# Patient Record
Sex: Female | Born: 1985 | Race: Black or African American | Hispanic: No | Marital: Married | Smoking: Former smoker
Health system: Southern US, Community
[De-identification: ages and names within clinical notes are randomized; demographics above are authoritative.]

## PROBLEM LIST (undated history)

## (undated) DIAGNOSIS — A630 Anogenital (venereal) warts: Secondary | ICD-10-CM

## (undated) DIAGNOSIS — E559 Vitamin D deficiency, unspecified: Secondary | ICD-10-CM

## (undated) DIAGNOSIS — IMO0002 Reserved for concepts with insufficient information to code with codable children: Secondary | ICD-10-CM

## (undated) HISTORY — DX: Vitamin D deficiency, unspecified: E55.9

## (undated) HISTORY — DX: Reserved for concepts with insufficient information to code with codable children: IMO0002

## (undated) HISTORY — DX: Anogenital (venereal) warts: A63.0

---
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
## 2007-01-10 ENCOUNTER — Emergency Department: Payer: Self-pay

## 2007-01-24 ENCOUNTER — Emergency Department: Payer: Self-pay | Admitting: Emergency Medicine

## 2009-03-16 ENCOUNTER — Emergency Department: Payer: Self-pay | Admitting: Emergency Medicine

## 2009-04-23 ENCOUNTER — Emergency Department: Payer: Self-pay | Admitting: Emergency Medicine

## 2009-08-30 ENCOUNTER — Emergency Department: Payer: Self-pay | Admitting: Emergency Medicine

## 2011-06-26 ENCOUNTER — Emergency Department: Payer: Self-pay | Admitting: Unknown Physician Specialty

## 2011-09-02 ENCOUNTER — Emergency Department: Payer: Self-pay | Admitting: Emergency Medicine

## 2012-04-27 ENCOUNTER — Emergency Department: Payer: Self-pay | Admitting: Emergency Medicine

## 2012-04-27 LAB — URINALYSIS, COMPLETE
Blood: NEGATIVE
Glucose,UR: NEGATIVE mg/dL (ref 0–75)
Ketone: NEGATIVE
Leukocyte Esterase: NEGATIVE
Nitrite: NEGATIVE
Protein: NEGATIVE
Specific Gravity: 1.019 (ref 1.003–1.030)
Squamous Epithelial: 6
WBC UR: 1 /HPF (ref 0–5)

## 2012-04-27 LAB — COMPREHENSIVE METABOLIC PANEL
Albumin: 3.4 g/dL (ref 3.4–5.0)
Anion Gap: 9 (ref 7–16)
Bilirubin,Total: 0.4 mg/dL (ref 0.2–1.0)
Calcium, Total: 8.2 mg/dL — ABNORMAL LOW (ref 8.5–10.1)
Co2: 22 mmol/L (ref 21–32)
EGFR (African American): >60
Glucose: 93 mg/dL (ref 65–99)
Potassium: 4.1 mmol/L (ref 3.5–5.1)
SGOT(AST): 23 U/L (ref 15–37)
Sodium: 143 mmol/L (ref 136–145)

## 2012-04-27 LAB — CBC
HGB: 13.3 g/dL (ref 12.0–16.0)
MCH: 32 pg (ref 26.0–34.0)
MCHC: 32.6 g/dL (ref 32.0–36.0)
MCV: 98 fL (ref 80–100)
Platelet: 352 10*3/uL (ref 150–440)
RDW: 12.9 % (ref 11.5–14.5)

## 2012-04-27 LAB — PREGNANCY, URINE: Pregnancy Test, Urine: NEGATIVE m[IU]/mL

## 2012-04-27 LAB — WET PREP, GENITAL

## 2012-10-13 ENCOUNTER — Emergency Department: Payer: Self-pay | Admitting: Emergency Medicine

## 2012-10-13 LAB — URINALYSIS, COMPLETE
Bilirubin,UR: NEGATIVE
Blood: NEGATIVE
Glucose,UR: NEGATIVE mg/dL (ref 0–75)
Leukocyte Esterase: NEGATIVE
RBC,UR: 1 /HPF (ref 0–5)
Squamous Epithelial: 3
WBC UR: 1 /HPF (ref 0–5)

## 2013-09-07 ENCOUNTER — Emergency Department: Payer: Self-pay | Admitting: Emergency Medicine

## 2015-03-24 LAB — HM PAP SMEAR

## 2015-03-25 LAB — HEMOGLOBIN A1C: Hgb A1c MFr Bld: 5.4 % (ref 4.0–6.0)

## 2015-03-25 LAB — LIPID PANEL
Cholesterol: 128 mg/dL (ref 0–200)
HDL: 56 mg/dL (ref 35–70)
LDL Cholesterol: 47 mg/dL
Triglycerides: 125 mg/dL (ref 40–160)

## 2015-03-25 LAB — TSH: TSH: 1.59 u[IU]/mL (ref <=5.90)

## 2015-05-09 ENCOUNTER — Telehealth: Payer: Self-pay | Admitting: Obstetrics and Gynecology

## 2015-05-10 NOTE — Telephone Encounter (Signed)
That should be fine, if it doesn't work let us know & I can send in a prescription

## 2015-05-10 NOTE — Telephone Encounter (Signed)
Pt called c/o slight vaginal odor, pt refilled her flagyl has only taking 1 pill, advised pt to complete course And to call us back if she still had sx

## 2015-05-19 ENCOUNTER — Encounter: Payer: Self-pay | Admitting: *Deleted

## 2015-05-20 ENCOUNTER — Encounter: Payer: Self-pay | Admitting: Obstetrics and Gynecology

## 2015-05-20 ENCOUNTER — Ambulatory Visit (INDEPENDENT_AMBULATORY_CARE_PROVIDER_SITE_OTHER): Payer: 59 | Admitting: Obstetrics and Gynecology

## 2015-05-20 VITALS — BP 115/86 | HR 93 | Temp 98.6°F | Ht 70.0 in | Wt 387.6 lb

## 2015-05-20 DIAGNOSIS — J0100 Acute maxillary sinusitis, unspecified: Secondary | ICD-10-CM | POA: Diagnosis not present

## 2015-05-20 DIAGNOSIS — H65191 Other acute nonsuppurative otitis media, right ear: Secondary | ICD-10-CM | POA: Diagnosis not present

## 2015-05-20 DIAGNOSIS — Z113 Encounter for screening for infections with a predominantly sexual mode of transmission: Secondary | ICD-10-CM | POA: Diagnosis not present

## 2015-05-20 MED ORDER — CEFDINIR 300 MG PO CAPS: 300.0000 mg | ORAL_CAPSULE | Freq: Two times a day (BID) | ORAL | Status: DC

## 2015-05-20 NOTE — Progress Notes (Signed)
Patient ID: Christina Bowen, female   DOB: 18-Oct-1986, 29 y.o.   MRN: [id number] Sexually Transmitted Disease Check: Patient presents for sexually transmitted disease check. Sexual history reviewed with the patient. STD exposure: current sexual partner thought to have history of none and admitted to cheating on her.  Previous history of STD:  HSV, trichomonas, HPV. Current symptoms include vaginal discharge: scant and yellow.  Contraception: condoms   O: scant bloody d/c noted on speculum exam- swab obtained, external vulva and vaginal vault w/o lesions.  A: STD screening- declines blood screening at this time  P: NuSwab- will call with results.    Marland KitchenSINUSITIS Onset: 5 days ago  Location: nasal passages and right ear Description: pressure and green drainage Modifying factors: none  Symptoms Cough: non-productive from postnasal drip  Discharge:  green Fever: none Sinus Pressure: bilateral frontal  Ears Blocked:  Right worse than left Teeth Ache:  mild Frontal Headache:  moderate Second Sickening:  none  Red Flags Change in mental state: none Change in vision: none   O: nasal mucosa red and boggy with green mucus noted Right tympanic membrane red and bulging with purelent fluid noted behind it.  A: sinusitis with right otitis media  P: OMnicef 300mg  bid x 14d Tylenol cold & sinus prn  Gennie Alma, CNM

## 2015-05-24 ENCOUNTER — Telehealth: Payer: Self-pay | Admitting: Obstetrics and Gynecology

## 2015-05-24 LAB — NUSWAB VAGINITIS PLUS (VG+)
Atopobium vaginae: HIGH Score — AB
Candida glabrata, NAA: NEGATIVE
Megasphaera 1: HIGH Score — AB

## 2015-05-24 NOTE — Telephone Encounter (Signed)
PT CALLED AND IS VERY EAGER ABOUT WANTING TO KNOW RESULTS OF HER VAGINAL SWAB SHE HAD DONE LAST Friday, WAS TOLD THAT SOMEONE WOULD CALL HER Monday, AND SHE HASNT HEARD ANYTHING, WANTS A CALL BACK TODAY BEFORE WE CLOSE.

## 2015-05-25 MED ORDER — METRONIDAZOLE 500 MG PO TABS: 500.0000 mg | ORAL_TABLET | Freq: Two times a day (BID) | ORAL | Status: DC

## 2015-05-25 NOTE — Telephone Encounter (Signed)
LMTRC. BV per nuswab. Flagyl erx.

## 2015-05-25 NOTE — Telephone Encounter (Signed)
Pt aware.

## 2015-05-26 ENCOUNTER — Other Ambulatory Visit: Payer: Self-pay | Admitting: Obstetrics and Gynecology

## 2015-05-26 DIAGNOSIS — N760 Acute vaginitis: Principal | ICD-10-CM

## 2015-05-26 DIAGNOSIS — B9689 Other specified bacterial agents as the cause of diseases classified elsewhere: Secondary | ICD-10-CM

## 2015-05-26 MED ORDER — CLINDAMYCIN HCL 300 MG PO CAPS: 300.0000 mg | ORAL_CAPSULE | Freq: Two times a day (BID) | ORAL | Status: DC

## 2015-05-26 NOTE — Progress Notes (Signed)
Quick Note:  Please let her know her swab came back showing BV only- I sent in a prescription for the cleocin suppositories- to place one vaginally at bedtime x 4 days, then can use remaining ones as needed. ______

## 2015-05-27 ENCOUNTER — Telehealth: Payer: Self-pay | Admitting: Obstetrics and Gynecology

## 2015-05-27 NOTE — Telephone Encounter (Signed)
PT WANTED TO KNOW IF THERE WAS A CREAM OR SUPOSITORY THAT SHE WAS SUPPOSED TO PICK UP FROM PHARMACY IN ADDITON TO HER OTHER MED.

## 2015-05-31 NOTE — Telephone Encounter (Signed)
We were still working on prior auth for the cleocin, so only one should have been ready at that time

## 2015-05-31 NOTE — Telephone Encounter (Signed)
PT AWARE  

## 2015-06-02 ENCOUNTER — Other Ambulatory Visit: Payer: 59

## 2015-06-02 DIAGNOSIS — Z113 Encounter for screening for infections with a predominantly sexual mode of transmission: Secondary | ICD-10-CM

## 2015-06-03 ENCOUNTER — Other Ambulatory Visit: Payer: Self-pay | Admitting: Obstetrics and Gynecology

## 2015-06-03 DIAGNOSIS — Z113 Encounter for screening for infections with a predominantly sexual mode of transmission: Secondary | ICD-10-CM

## 2015-06-04 LAB — HIV ANTIBODY (ROUTINE TESTING W REFLEX): HIV Screen 4th Generation wRfx: NONREACTIVE

## 2015-06-04 LAB — RPR: RPR Ser Ql: NONREACTIVE

## 2015-06-04 LAB — HEPATITIS C ANTIBODY: Hep C Virus Ab: <0.1 s/co ratio (ref 0.0–0.9)

## 2015-06-07 ENCOUNTER — Telehealth: Payer: Self-pay | Admitting: *Deleted

## 2015-06-07 NOTE — Telephone Encounter (Signed)
-----   Message from Evonnie Pat, [place] sent at 06/04/2015 11:55 AM EDT ----- Please let her know all STD blood results were negative

## 2015-06-07 NOTE — Telephone Encounter (Signed)
Notified pt of normal lab results

## 2015-06-10 ENCOUNTER — Emergency Department
Admission: EM | Admit: 2015-06-10 | Discharge: 2015-06-10 | Disposition: A | Discharge status: Home / Self Care | Payer: Managed Care, Other (non HMO) | Attending: Emergency Medicine | Admitting: Emergency Medicine

## 2015-06-10 DIAGNOSIS — J029 Acute pharyngitis, unspecified: Secondary | ICD-10-CM | POA: Insufficient documentation

## 2015-06-10 DIAGNOSIS — Z79899 Other long term (current) drug therapy: Secondary | ICD-10-CM | POA: Insufficient documentation

## 2015-06-10 DIAGNOSIS — Z87891 Personal history of nicotine dependence: Secondary | ICD-10-CM | POA: Insufficient documentation

## 2015-06-10 LAB — POCT RAPID STREP A: Streptococcus, Group A Screen (Direct): NEGATIVE

## 2015-06-10 MED ORDER — IBUPROFEN 800 MG PO TABS: 800.0000 mg | ORAL_TABLET | Freq: Three times a day (TID) | ORAL | Status: DC | PRN

## 2015-06-10 NOTE — ED Notes (Signed)
Dr Archie Balboa in room with patient

## 2015-06-10 NOTE — ED Provider Notes (Signed)
[facility] Emergency Department Provider Note    ____________________________________________  Time seen: 0450  I have reviewed the triage vital signs and the nursing notes.   HISTORY  Chief Complaint Sore Throat   History limited by: Not Limited   HPI Christina Bowen is a 29 y.o. female who presents to the emergency today because of sore throat. Patient states it started yesterday has gotten worse. Patient states she has also had a little bit of ear discomfort. Furthermore the patient states she usually has prongs with her sinuses but has not noticed any drainage recently. Did notice some white plaques on her tonsils. Denies any fevers. He tried taking one Advil prior to presenting to the emergency department. Patient does have some concerns they might be an oral STD given that she is in a relationship.     Past Medical History  Diagnosis Date  . HPV (human papilloma virus) anogenital infection   . Vitamin D deficiency disease   . LGSIL (low grade squamous intraepithelial dysplasia)     There are no active problems to display for this patient.   No past surgical history on file.  Current Outpatient Rx  Name  Route  Sig  Dispense  Refill  . cefdinir (OMNICEF) 300 MG capsule   Oral   Take 1 capsule (300 mg total) by mouth 2 (two) times daily.   14 capsule   1   . clindamycin (CLEOCIN) 300 MG capsule   Oral   Take 1 capsule (300 mg total) by mouth 2 (two) times daily. For seven days   14 capsule   2   . metroNIDAZOLE (FLAGYL) 500 MG tablet   Oral   Take 1 tablet (500 mg total) by mouth 2 (two) times daily.   14 tablet   0   . TRI-PREVIFEM 0.18/0.215/0.25 MG-35 MCG tablet   Oral   Take 1 tablet by mouth daily.      6     Dispense as written.   . Vitamin D, Ergocalciferol, (DRISDOL) 50000 UNITS CAPS capsule   Oral   Take 1 capsule by mouth 2 (two) times a week.      3     Allergies Review of patient's allergies indicates no  known allergies.  Family History  Problem Relation Age of Onset  . Diabetes Father   . Heart disease Maternal Grandmother     Social History History  Substance Use Topics  . Smoking status: Former Research scientist (life sciences)  . Smokeless tobacco: Never Used  . Alcohol Use: Yes    Review of Systems  Constitutional: Negative for fever. Cardiovascular: Negative for chest pain. Respiratory: Negative for shortness of breath. Gastrointestinal: Negative for abdominal pain, vomiting and diarrhea. Genitourinary: Negative for dysuria. Musculoskeletal: Negative for back pain. Skin: Negative for rash. Neurological: Negative for headaches, focal weakness or numbness.   10-point ROS otherwise negative.  ____________________________________________   PHYSICAL EXAM:  VITAL SIGNS: ED Triage Vitals  Enc Vitals Group     BP 06/10/15 0423 180/92 mmHg     Pulse Rate 06/10/15 0423 85     Resp 06/10/15 0423 20     Temp 06/10/15 0423 98 F (36.7 C)     Temp Source 06/10/15 0423 Oral     SpO2 06/10/15 0423 98 %     Weight 06/10/15 0423 370 lb (167.831 kg)     Height 06/10/15 0423 5\' 10"  (1.778 m)     Head Cir --      Peak  Flow --      Pain Score 06/10/15 0424 10   Constitutional: Alert and oriented. Well appearing and in no distress. Eyes: Conjunctivae are normal. PERRL. Normal extraocular movements. ENT   Head: Normocephalic and atraumatic.   Nose: No congestion/rhinnorhea.   Mouth/Throat: Mucous membranes are moist. Pharyngeal exudate or erythema. No unilateral swelling. Uvula midline.   Neck: No stridor. Hematological/Lymphatic/Immunilogical: No cervical lymphadenopathy. Cardiovascular: Normal rate, regular rhythm.  No murmurs, rubs, or gallops. Respiratory: Normal respiratory effort without tachypnea nor retractions. Breath sounds are clear and equal bilaterally. No wheezes/rales/rhonchi. Genitourinary: Deferred Musculoskeletal: Normal range of motion in all extremities. No joint  effusions.  No lower extremity tenderness nor edema. Neurologic:  Normal speech and language. No gross focal neurologic deficits are appreciated. Speech is normal.  Skin:  Skin is warm, dry and intact. No rash noted. Psychiatric: Mood and affect are normal. Speech and behavior are normal. Patient exhibits appropriate insight and judgment.  ____________________________________________    LABS (pertinent positives/negatives)  Labs Reviewed  RAPID STREP SCREEN (NOT AT [facility])  POCT RAPID STREP A     ____________________________________________   EKG  None  ____________________________________________    RADIOLOGY  None  ____________________________________________   PROCEDURES  Procedure(s) performed: None  Critical Care performed: No  ____________________________________________   INITIAL IMPRESSION / ASSESSMENT AND PLAN / ED COURSE  Pertinent labs & imaging results that were available during my care of the patient were reviewed by me and considered in my medical decision making (see chart for details).  She here with sore throat. Rapid strep negative. Believe this is likely to be viral pharyngitis. I did discuss with patient she has any concerns for STD she can follow up with primary care physician.  ____________________________________________   FINAL CLINICAL IMPRESSION(S) / ED DIAGNOSES  Final diagnoses:  Pharyngitis     Nance Pear, MD 06/10/15 [phone]

## 2015-06-10 NOTE — Discharge Instructions (Signed)
Please seek medical attention for any high fevers, chest pain, shortness of breath, change in behavior, persistent vomiting, bloody stool or any other new or concerning symptoms.  Pharyngitis Pharyngitis is redness, pain, and swelling (inflammation) of your pharynx.  CAUSES  Pharyngitis is usually caused by infection. Most of the time, these infections are from viruses (viral) and are part of a cold. However, sometimes pharyngitis is caused by bacteria (bacterial). Pharyngitis can also be caused by allergies. Viral pharyngitis may be spread from person to person by coughing, sneezing, and personal items or utensils (cups, forks, spoons, toothbrushes). Bacterial pharyngitis may be spread from person to person by more intimate contact, such as kissing.  SIGNS AND SYMPTOMS  Symptoms of pharyngitis include:   Sore throat.   Tiredness (fatigue).   Low-grade fever.   Headache.  Joint pain and muscle aches.  Skin rashes.  Swollen lymph nodes.  Plaque-like film on throat or tonsils (often seen with bacterial pharyngitis). DIAGNOSIS  Your health care provider will ask you questions about your illness and your symptoms. Your medical history, along with a physical exam, is often all that is needed to diagnose pharyngitis. Sometimes, a rapid strep test is done. Other lab tests may also be done, depending on the suspected cause.  TREATMENT  Viral pharyngitis will usually get better in 3-4 days without the use of medicine. Bacterial pharyngitis is treated with medicines that kill germs (antibiotics).  HOME CARE INSTRUCTIONS   Drink enough water and fluids to keep your urine clear or pale yellow.   Only take over-the-counter or prescription medicines as directed by your health care provider:   If you are prescribed antibiotics, make sure you finish them even if you start to feel better.   Do not take aspirin.   Get lots of rest.   Gargle with 8 oz of salt water ( tsp of salt per 1  qt of water) as often as every 1-2 hours to soothe your throat.   Throat lozenges (if you are not at risk for choking) or sprays may be used to soothe your throat. SEEK MEDICAL CARE IF:   You have large, tender lumps in your neck.  You have a rash.  You cough up green, yellow-brown, or bloody spit. SEEK IMMEDIATE MEDICAL CARE IF:   Your neck becomes stiff.  You drool or are unable to swallow liquids.  You vomit or are unable to keep medicines or liquids down.  You have severe pain that does not go away with the use of recommended medicines.  You have trouble breathing (not caused by a stuffy nose). MAKE SURE YOU:   Understand these instructions.  Will watch your condition.  Will get help right away if you are not doing well or get worse. Document Released: 11/19/2005 Document Revised: 09/09/2013 Document Reviewed: 07/27/2013 [facility] Patient Information 2015 [place], [place]. This information is not intended to replace advice given to you by your health care provider. Make sure you discuss any questions you have with your health care provider.

## 2015-06-10 NOTE — ED Notes (Signed)
Pt to ED c/o sore throat since yesterday.

## 2015-06-10 NOTE — ED Notes (Addendum)
Pt reports having had a sore throat since yesterday morning, with white patches in her throat. Pt denies fevers. Pt reports that her ears are starting to ache now also. Pt is worried that it might be an oral STD, just trying to figure out what all of the possibilities with having the red and white patches in her throat. Pt reports taking 1 advil prior to coming to the ER

## 2015-06-12 LAB — CULTURE, GROUP A STREP (THRC): Special Requests: NORMAL

## 2015-07-22 ENCOUNTER — Other Ambulatory Visit: Payer: Self-pay | Admitting: Obstetrics and Gynecology

## 2015-07-22 ENCOUNTER — Telehealth: Payer: Self-pay | Admitting: Obstetrics and Gynecology

## 2015-07-22 MED ORDER — METRONIDAZOLE 500 MG PO TABS: 500.0000 mg | ORAL_TABLET | Freq: Two times a day (BID) | ORAL | Status: DC

## 2015-07-22 NOTE — Telephone Encounter (Signed)
Pt is having vaginal d/c with odor would like refill on her flagyl please

## 2015-07-22 NOTE — Telephone Encounter (Signed)
Notified pt rx ready 

## 2015-07-22 NOTE — Telephone Encounter (Signed)
Patient called requesting a call back regarding vaginal odor and discharge. You can reach her at [phone]

## 2015-07-22 NOTE — Telephone Encounter (Signed)
Please let her know i sent it in

## 2015-10-26 ENCOUNTER — Ambulatory Visit (INDEPENDENT_AMBULATORY_CARE_PROVIDER_SITE_OTHER): Payer: 59 | Admitting: Obstetrics and Gynecology

## 2015-10-26 ENCOUNTER — Encounter: Payer: Self-pay | Admitting: Obstetrics and Gynecology

## 2015-10-26 VITALS — BP 111/85 | HR 87 | Wt >= 6400 oz

## 2015-10-26 DIAGNOSIS — R87612 Low grade squamous intraepithelial lesion on cytologic smear of cervix (LGSIL): Secondary | ICD-10-CM

## 2015-10-26 MED ORDER — NORETHIN-ETH ESTRADIOL-FE 0.8-25 MG-MCG PO CHEW
1.0000 | CHEWABLE_TABLET | Freq: Every day | ORAL | Status: DC
Start: 2015-10-26 — End: 2018-05-06

## 2015-10-26 NOTE — Progress Notes (Signed)
Subjective:     Patient ID: Christina Bowen, female   DOB: [birth year], 29 y.o.   MRN: [id number]  HPI Here for follow up pap- had LGSIL and colpo 6 months ago Recurrent BV  Review of Systems Last treated for BV this week with Clindesse gel and feels like still battling with BV reoccuring. Also had sex 2 weeks ago with new partner and expereinced pain with deep penetration, none since.  Stopped OCPs early Oct due to amenorrhea and had a period the first of nov, desires a different pill that will give her a period every month.    Objective:   Physical Exam A&O x4 Well groomed obese female in no distress Pelvic exam: normal external genitalia, vulva, vagina, cervix, uterus and adnexa, WET MOUNT done - results: negative for pathogens, normal epithelial cells, vaginal pH is 6.5.    Assessment:     Follow up LGSIL pap Recurrent BV OCP user     Plan:     Switch to Generesse- to start on 4th day of next menses Stop clindesse until confirmed BV in future Will repeat pap in 6 months unless today's results indicate otherwise.  Melody Shindler, CNM

## 2015-10-31 ENCOUNTER — Other Ambulatory Visit: Payer: Self-pay | Admitting: Obstetrics and Gynecology

## 2015-11-01 LAB — CYTOLOGY - PAP

## 2016-01-12 ENCOUNTER — Telehealth: Payer: Self-pay | Admitting: Obstetrics and Gynecology

## 2016-01-12 NOTE — Telephone Encounter (Signed)
Pt is on the metronylazol pill, and she wanted to know if there was another pill that is effective because she thinks she is getting immune to it and she doesn't want the cream because its 100.00, but if not that's fine she just wanted to know.

## 2016-01-12 NOTE — Telephone Encounter (Signed)
tindamax is an option, but it can be more expensive too

## 2016-01-18 ENCOUNTER — Other Ambulatory Visit: Payer: Self-pay | Admitting: Obstetrics and Gynecology

## 2016-01-18 MED ORDER — TINIDAZOLE 500 MG PO TABS: 500.0000 mg | ORAL_TABLET | Freq: Every day | ORAL | Status: DC

## 2016-01-18 NOTE — Telephone Encounter (Signed)
Pt would like to try the tindamax

## 2016-01-18 NOTE — Telephone Encounter (Signed)
Left detailed message about pts medication

## 2016-02-03 ENCOUNTER — Telehealth: Payer: Self-pay | Admitting: Obstetrics and Gynecology

## 2016-02-03 NOTE — Telephone Encounter (Signed)
Patient called stating that she went to get her script for sinus infection but it was too expensive. She would like something cheaper called in. Thanks

## 2016-02-14 NOTE — Telephone Encounter (Signed)
Called pt several times, I advsied pt she may need to make appt and be seen

## 2016-03-29 ENCOUNTER — Ambulatory Visit (INDEPENDENT_AMBULATORY_CARE_PROVIDER_SITE_OTHER): Payer: Managed Care, Other (non HMO) | Admitting: Obstetrics and Gynecology

## 2016-03-29 ENCOUNTER — Encounter: Payer: Self-pay | Admitting: Obstetrics and Gynecology

## 2016-03-29 VITALS — BP 134/89 | HR 91 | Wt >= 6400 oz

## 2016-03-29 DIAGNOSIS — Z01419 Encounter for gynecological examination (general) (routine) without abnormal findings: Secondary | ICD-10-CM | POA: Diagnosis not present

## 2016-03-29 DIAGNOSIS — R350 Frequency of micturition: Secondary | ICD-10-CM | POA: Diagnosis not present

## 2016-03-29 LAB — POCT URINALYSIS DIPSTICK
Bilirubin, UA: NEGATIVE
Glucose, UA: NEGATIVE
KETONES UA: NEGATIVE
Nitrite, UA: NEGATIVE
PH UA: 6.5
PROTEIN UA: NEGATIVE
RBC UA: NEGATIVE
Spec Grav, UA: 1.01
UROBILINOGEN UA: NEGATIVE

## 2016-03-29 MED ORDER — TINIDAZOLE 500 MG PO TABS: 500.0000 mg | ORAL_TABLET | Freq: Every day | ORAL | Status: DC

## 2016-03-29 NOTE — Patient Instructions (Signed)
Place annual gynecologic exam patient instructions here.

## 2016-03-29 NOTE — Progress Notes (Signed)
  Subjective:     Christina Bowen is a 30 y.o. female and is here for a comprehensive physical exam. The patient reports weight gain, pedal edema, and occasional increased BP.  Social History   Social History  . Marital Status: Single    Spouse Name: N/A  . Number of Children: N/A  . Years of Education: N/A   Occupational History  . Not on file.   Social History Main Topics  . Smoking status: Former Research scientist (life sciences)  . Smokeless tobacco: Never Used  . Alcohol Use: Yes  . Drug Use: No  . Sexual Activity: Yes    Birth Control/ Protection: Pill   Other Topics Concern  . Not on file   Social History Narrative   Health Maintenance  Topic Date Due  . Samul Dada  08/15/2007  . INFLUENZA VACCINE  07/03/2016  . PAP SMEAR  10/30/2018  . HIV Screening  Completed    The following portions of the patient's history were reviewed and updated as appropriate: allergies, current medications, past family history, past medical history, past social history, past surgical history and problem list.  Review of Systems Pertinent items noted in HPI and remainder of comprehensive ROS otherwise negative.   Objective:    General appearance: alert, cooperative, appears stated age and morbidly obese Neck: no carotid bruit, no JVD, supple, symmetrical, trachea midline and thyroid not enlarged, symmetric, no tenderness/mass/nodules Lungs: clear to auscultation bilaterally Breasts: normal appearance, no masses or tenderness Heart: regular rate and rhythm, S1, S2 normal, no murmur, click, rub or gallop Abdomen: soft, non-tender; bowel sounds normal; no masses,  no organomegaly Pelvic: cervix normal in appearance, external genitalia normal, no adnexal masses or tenderness, no cervical motion tenderness, rectovaginal septum normal, uterus normal size, shape, and consistency and vagina normal without discharge Extremities: extremities normal, atraumatic, no cyanosis or edema    Assessment:    Healthy female  exam. Morbid obesity H/O recurrent BV      Plan:  Labs obtained with repeat pap   See After Visit Summary for Counseling Recommendations

## 2016-03-30 ENCOUNTER — Other Ambulatory Visit: Payer: Self-pay | Admitting: Obstetrics and Gynecology

## 2016-03-30 LAB — RPR: RPR Ser Ql: NONREACTIVE

## 2016-03-30 LAB — LIPID PANEL
CHOLESTEROL TOTAL: 175 mg/dL (ref 100–199)
Chol/HDL Ratio: 3.8 ratio units (ref 0.0–4.4)
HDL: 46 mg/dL (ref >39)
LDL Calculated: 88 mg/dL (ref 0–99)
Triglycerides: 203 mg/dL — ABNORMAL HIGH (ref 0–149)
VLDL CHOLESTEROL CAL: 41 mg/dL — AB (ref 5–40)

## 2016-03-30 LAB — COMPREHENSIVE METABOLIC PANEL
ALBUMIN: 4.2 g/dL (ref 3.5–5.5)
ALT: 22 IU/L (ref 0–32)
AST: 16 IU/L (ref 0–40)
Albumin/Globulin Ratio: 1.9 (ref 1.2–2.2)
Alkaline Phosphatase: 43 IU/L (ref 39–117)
BUN / CREAT RATIO: 17 (ref 9–23)
BUN: 11 mg/dL (ref 6–20)
Bilirubin Total: <0.2 mg/dL (ref 0.0–1.2)
CO2: 19 mmol/L (ref 18–29)
CREATININE: 0.63 mg/dL (ref 0.57–1.00)
Calcium: 9 mg/dL (ref 8.7–10.2)
Chloride: 104 mmol/L (ref 96–106)
GFR, EST AFRICAN AMERICAN: 142 mL/min/{1.73_m2} (ref >59)
GFR, EST NON AFRICAN AMERICAN: 123 mL/min/{1.73_m2} (ref >59)
GLOBULIN, TOTAL: 2.2 g/dL (ref 1.5–4.5)
GLUCOSE: 95 mg/dL (ref 65–99)
Potassium: 4.4 mmol/L (ref 3.5–5.2)
SODIUM: 141 mmol/L (ref 134–144)
Total Protein: 6.4 g/dL (ref 6.0–8.5)

## 2016-03-30 LAB — HEPATITIS C ANTIBODY

## 2016-03-30 LAB — HEMOGLOBIN A1C
Est. average glucose Bld gHb Est-mCnc: 108 mg/dL
Hgb A1c MFr Bld: 5.4 % (ref 4.8–5.6)

## 2016-03-30 LAB — THYROID PANEL WITH TSH
Free Thyroxine Index: 1.7 (ref 1.2–4.9)
T3 Uptake Ratio: 30 % (ref 24–39)
T4, Total: 5.6 ug/dL (ref 4.5–12.0)
TSH: 1.01 u[IU]/mL (ref 0.450–4.500)

## 2016-03-30 LAB — HIV ANTIBODY (ROUTINE TESTING W REFLEX): HIV SCREEN 4TH GENERATION: NONREACTIVE

## 2016-04-02 LAB — CYTOLOGY - PAP

## 2016-04-25 ENCOUNTER — Encounter: Payer: 59 | Admitting: Obstetrics and Gynecology

## 2016-05-31 ENCOUNTER — Telehealth: Payer: Self-pay | Admitting: *Deleted

## 2016-05-31 ENCOUNTER — Other Ambulatory Visit: Payer: Self-pay | Admitting: Obstetrics and Gynecology

## 2016-05-31 MED ORDER — FLUCONAZOLE 150 MG PO TABS: 150.0000 mg | ORAL_TABLET | Freq: Once | ORAL | Status: DC

## 2016-05-31 NOTE — Telephone Encounter (Signed)
pls advise

## 2016-05-31 NOTE — Telephone Encounter (Signed)
Please let her know i sent in RX for diflucan and give instructions

## 2016-05-31 NOTE — Telephone Encounter (Signed)
Left detailed message.   

## 2016-05-31 NOTE — Telephone Encounter (Signed)
Patietn called and stated that she thinks shge has a yeast infection . She was wondering if Melody can send her something to her pharmacy or if she can suggest something over the counter. Her call back number is [phone]. Thanks

## 2016-07-02 ENCOUNTER — Encounter: Payer: Self-pay | Admitting: Obstetrics and Gynecology

## 2016-07-02 ENCOUNTER — Ambulatory Visit (INDEPENDENT_AMBULATORY_CARE_PROVIDER_SITE_OTHER): Payer: Managed Care, Other (non HMO) | Admitting: Obstetrics and Gynecology

## 2016-07-02 VITALS — BP 120/66 | HR 85 | Ht 70.0 in | Wt >= 6400 oz

## 2016-07-02 DIAGNOSIS — D239 Other benign neoplasm of skin, unspecified: Secondary | ICD-10-CM | POA: Diagnosis not present

## 2016-07-02 DIAGNOSIS — E669 Obesity, unspecified: Secondary | ICD-10-CM

## 2016-07-02 DIAGNOSIS — R87619 Unspecified abnormal cytological findings in specimens from cervix uteri: Secondary | ICD-10-CM

## 2016-07-02 DIAGNOSIS — D229 Melanocytic nevi, unspecified: Secondary | ICD-10-CM

## 2016-07-02 MED ORDER — TRIAMCINOLONE ACETONIDE 0.1 % EX CREA: 1.0000 "application " | TOPICAL_CREAM | Freq: Two times a day (BID) | CUTANEOUS | 1 refills | Status: DC

## 2016-07-02 NOTE — Progress Notes (Signed)
Subjective:     Patient ID: Christina Bowen, female   DOB: Mar 14, 1986, 30 y.o.   MRN: [id number]  HPI Reports external burning with increased vaginal discharge 2-3 weeks ago, used metrogel and symptoms resolved, but wanted to be checked to be sure there was no new exposure to STDs. Also have lesion on face under left eye, that appears to be getting bigger- states it was treated a few years ago as a wart, but has reappeared.  Review of Systems See above    Objective:   Physical Exam A&O x4  well groomed morbidly obese female in no distress Blood pressure 120/66, pulse 85, height 5\' 10"  (1.778 m), weight (!) 405 lb 4.8 oz (183.8 kg), last menstrual period 06/24/2016. Skin - no sores or suspicious lesions or rashes or color changes, does have 24mm x 41mm HPV type lesion under left eye- raised without tenderness Pelvic exam: normal external genitalia, vulva, vagina, cervix, uterus and adnexa, WET MOUNT done - results: negative for pathogens, normal epithelial cells.    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

## 2016-07-18 ENCOUNTER — Ambulatory Visit: Payer: Managed Care, Other (non HMO) | Admitting: Obstetrics and Gynecology

## 2016-08-29 ENCOUNTER — Encounter (HOSPITAL_COMMUNITY): Payer: Self-pay | Admitting: Emergency Medicine

## 2016-08-29 ENCOUNTER — Emergency Department (HOSPITAL_COMMUNITY)
Admission: EM | Admit: 2016-08-29 | Discharge: 2016-08-29 | Disposition: A | Discharge status: Home / Self Care | Payer: Managed Care, Other (non HMO) | Attending: Emergency Medicine | Admitting: Emergency Medicine

## 2016-08-29 DIAGNOSIS — Z87891 Personal history of nicotine dependence: Secondary | ICD-10-CM | POA: Diagnosis not present

## 2016-08-29 DIAGNOSIS — L509 Urticaria, unspecified: Secondary | ICD-10-CM | POA: Insufficient documentation

## 2016-08-29 MED ORDER — HYDROXYZINE HCL 25 MG PO TABS
25.0000 mg | ORAL_TABLET | Freq: Once | ORAL | Status: AC
  Administered 2016-08-29: 25 mg via ORAL
  Filled 2016-08-29: qty 1

## 2016-08-29 MED ORDER — HYDROXYZINE HCL 25 MG PO TABS: 25.0000 mg | ORAL_TABLET | Freq: Three times a day (TID) | ORAL | 0 refills | Status: DC | PRN

## 2016-08-29 MED ORDER — FAMOTIDINE 20 MG PO TABS
20.0000 mg | ORAL_TABLET | Freq: Once | ORAL | Status: AC
  Administered 2016-08-29: 20 mg via ORAL
  Filled 2016-08-29: qty 1

## 2016-08-29 MED ORDER — PREDNISONE 20 MG PO TABS
60.0000 mg | ORAL_TABLET | Freq: Once | ORAL | Status: AC
  Administered 2016-08-29: 60 mg via ORAL
  Filled 2016-08-29 (×2): qty 3

## 2016-08-29 MED ORDER — FAMOTIDINE 20 MG PO TABS
20.0000 mg | ORAL_TABLET | Freq: Two times a day (BID) | ORAL | 0 refills | Status: DC
Start: 2016-08-29 — End: 2017-04-30

## 2016-08-29 MED ORDER — PREDNISONE 10 MG PO TABS
20.0000 mg | ORAL_TABLET | Freq: Two times a day (BID) | ORAL | 0 refills | Status: DC
Start: 2016-08-29 — End: 2016-11-28

## 2016-08-29 NOTE — Discharge Instructions (Signed)
The medication for itching can make you sleepy. Do not drive while taking it.

## 2016-08-29 NOTE — ED Provider Notes (Signed)
[facility] DEPT Provider Note   CSN: [id number] Arrival date & time: 08/29/16  0017     History   Chief Complaint Chief Complaint  Patient presents with  . Urticaria    HPI Christina Bowen is a 30 y.o. female who presents to the ED with a rash. The rash started tonight and patient complains of itching. 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 Patient denies any new foods, medications, no new soap or lotions. The rash has spread since it first started tonight. She denies shortness of breath or difficulty swallowing.   HPI  Past Medical History:  Diagnosis Date  . HPV (human papilloma virus) anogenital infection   . LGSIL (low grade squamous intraepithelial dysplasia)   . Vitamin D deficiency disease     There are no active problems to display for this patient.   History reviewed. No pertinent surgical history.  OB History    Gravida Para Term Preterm AB Living   0 0 0 0 0 0   SAB TAB Ectopic Multiple Live Births   0 0 0 0         Home Medications    Prior to Admission medications   Medication Sig Start Date End Date Taking? Authorizing Provider  famotidine (PEPCID) 20 MG tablet Take 1 tablet (20 mg total) by mouth 2 (two) times daily. 08/29/16   [place], NP  fluconazole (DIFLUCAN) 150 MG tablet Take 1 tablet (150 mg total) by mouth once. Can take additional dose three days later if symptoms persist Patient not taking: Reported on 07/02/2016 05/31/16   Melody N Shambley, CNM  hydrOXYzine (ATARAX/VISTARIL) 25 MG tablet Take 1 tablet (25 mg total) by mouth every 8 (eight) hours as needed. 08/29/16   Davinity Fanara Bunnie Pion, NP  ibuprofen (ADVIL,MOTRIN) 800 MG tablet Take 1 tablet (800 mg total) by mouth every 8 (eight) hours as needed. Patient not taking: Reported on 07/02/2016 06/10/15   Nance Pear, MD  Norethindrone-Ethinyl Estradiol-Fe (GENERESSE) 0.8-25 MG-MCG tablet Chew 1 tablet by mouth daily. Patient not  taking: Reported on 07/02/2016 10/26/15   Melody N Shambley, CNM  predniSONE (DELTASONE) 10 MG tablet Take 2 tablets (20 mg total) by mouth 2 (two) times daily with a meal. 08/29/16   Darrien Laakso Bunnie Pion, NP  tinidazole (TINDAMAX) 500 MG tablet Take 1 tablet (500 mg total) by mouth daily with breakfast. Patient not taking: Reported on 07/02/2016 03/29/16   Melody N Shambley, CNM  triamcinolone cream (KENALOG) 0.1 % Apply 1 application topically 2 (two) times daily. 07/02/16   Melody N Shambley, CNM  Vitamin D, Ergocalciferol, (DRISDOL) 50000 UNITS CAPS capsule Take 1 capsule by mouth 2 (two) times a week. 03/31/15   Historical Provider, MD    Family History Family History  Problem Relation Age of Onset  . Diabetes Father   . Heart disease Maternal Grandmother     Social History Social History  Substance Use Topics  . Smoking status: Former Research scientist (life sciences)  . Smokeless tobacco: Never Used  . Alcohol use Yes     Allergies   Review of patient's allergies indicates no known allergies.   Review of Systems Review of Systems  Constitutional: Negative for fever.  HENT: Positive for congestion. Negative for sore throat and trouble swallowing.   Respiratory: Negative for shortness of breath.   Cardiovascular: Negative for chest pain.  Gastrointestinal: Negative for abdominal pain, nausea and vomiting.  Musculoskeletal: Negative for  neck stiffness.  Skin: Positive for rash.  Neurological: Negative for syncope and headaches.  Psychiatric/Behavioral: Negative for confusion. The patient is not nervous/anxious.      Physical Exam Updated Vital Signs BP 153/95 (BP Location: Left Arm)   Pulse 89   Temp 98.4 F (36.9 C) (Oral)   Resp 20   SpO2 99%   Physical Exam  Constitutional: She is oriented to person, place, and time. She appears well-developed and well-nourished.  HENT:  Head: Normocephalic and atraumatic.  Eyes: EOM are normal.  Neck: Neck supple.  Cardiovascular: Normal rate and regular  rhythm.   Pulmonary/Chest: Effort normal. No respiratory distress. She has no wheezes.  Abdominal: Soft. There is no tenderness.  Musculoskeletal: Normal range of motion.  Neurological: She is alert and oriented to person, place, and time. No cranial nerve deficit.  Skin: Skin is warm and dry. Rash noted.  Raised, red, hive like areas noted on the left side of the back, bilateral upper arms and right breast.   Psychiatric: She has a normal mood and affect. Her behavior is normal.  Nursing note and vitals reviewed.    ED Treatments / Results  Labs (all labs ordered are listed, but only abnormal results are displayed) Labs Reviewed - No data to display   Radiology No results found.  Procedures Procedures (including critical care time)  Medications Ordered in ED Medications  predniSONE (DELTASONE) tablet 60 mg (not administered)  famotidine (PEPCID) tablet 20 mg (20 mg Oral Given 08/29/16 0109)  hydrOXYzine (ATARAX/VISTARIL) tablet 25 mg (25 mg Oral Given 08/29/16 0109)     Initial Impression / Assessment and Plan / ED Course  I have reviewed the triage vital signs and the nursing notes.   Clinical Course    Final Clinical Impressions(s) / ED Diagnoses  30 y.o. female with rash and itching stable for d/c without difficulty breathing, no swelling of her throat and in no acute distress. Discussed with the patient clinical findings and plan of care and all questioned fully answered. She will f/u with her PCP or return here for worsening symptoms.    Final diagnoses:  Urticaria    New Prescriptions New Prescriptions   FAMOTIDINE (PEPCID) 20 MG TABLET    Take 1 tablet (20 mg total) by mouth 2 (two) times daily.   HYDROXYZINE (ATARAX/VISTARIL) 25 MG TABLET    Take 1 tablet (25 mg total) by mouth every 8 (eight) hours as needed.   PREDNISONE (DELTASONE) 10 MG TABLET    Take 2 tablets (20 mg total) by mouth 2 (two) times daily with a meal.     Ashley Murrain, NP 08/29/16  [id number]    Ripley Fraise, MD 08/29/16 [phone]

## 2016-08-29 NOTE — ED Triage Notes (Signed)
Patient with welts on back and breasts and forearms.  Patient states that they just came up, not sure if they are bites or if it is just hives.  She states that they are just itchy.

## 2016-09-11 ENCOUNTER — Other Ambulatory Visit: Payer: Self-pay | Admitting: *Deleted

## 2016-09-26 ENCOUNTER — Other Ambulatory Visit: Payer: Self-pay | Admitting: Obstetrics and Gynecology

## 2016-10-31 ENCOUNTER — Emergency Department (HOSPITAL_COMMUNITY)
Admission: EM | Admit: 2016-10-31 | Discharge: 2016-11-01 | Disposition: A | Discharge status: Home / Self Care | Payer: Managed Care, Other (non HMO) | Attending: Emergency Medicine | Admitting: Emergency Medicine

## 2016-10-31 ENCOUNTER — Encounter (HOSPITAL_COMMUNITY): Payer: Self-pay | Admitting: *Deleted

## 2016-10-31 DIAGNOSIS — R350 Frequency of micturition: Secondary | ICD-10-CM | POA: Diagnosis present

## 2016-10-31 DIAGNOSIS — B9689 Other specified bacterial agents as the cause of diseases classified elsewhere: Secondary | ICD-10-CM | POA: Diagnosis not present

## 2016-10-31 DIAGNOSIS — N760 Acute vaginitis: Secondary | ICD-10-CM | POA: Diagnosis not present

## 2016-10-31 DIAGNOSIS — Z87891 Personal history of nicotine dependence: Secondary | ICD-10-CM | POA: Insufficient documentation

## 2016-10-31 LAB — URINALYSIS, ROUTINE W REFLEX MICROSCOPIC
Bilirubin Urine: NEGATIVE
GLUCOSE, UA: NEGATIVE mg/dL
HGB URINE DIPSTICK: NEGATIVE
Ketones, ur: NEGATIVE mg/dL
Leukocytes, UA: NEGATIVE
Nitrite: NEGATIVE
PH: 6 (ref 5.0–8.0)
Protein, ur: NEGATIVE mg/dL
SPECIFIC GRAVITY, URINE: 1.027 (ref 1.005–1.030)

## 2016-10-31 LAB — POC URINE PREG, ED: PREG TEST UR: NEGATIVE

## 2016-10-31 NOTE — ED Triage Notes (Signed)
Pt c/o urinary frequency and foul odor. Denies discharge or abdominal pain

## 2016-11-01 LAB — WET PREP, GENITAL
Sperm: NONE SEEN
Trich, Wet Prep: NONE SEEN
YEAST WET PREP: NONE SEEN

## 2016-11-01 LAB — RPR: RPR Ser Ql: NONREACTIVE

## 2016-11-01 LAB — GC/CHLAMYDIA PROBE AMP (~~LOC~~) NOT AT ARMC
Chlamydia: NEGATIVE
NEISSERIA GONORRHEA: NEGATIVE

## 2016-11-01 LAB — HIV ANTIBODY (ROUTINE TESTING W REFLEX): HIV Screen 4th Generation wRfx: NONREACTIVE

## 2016-11-01 MED ORDER — METRONIDAZOLE 500 MG PO TABS: 500.0000 mg | ORAL_TABLET | Freq: Two times a day (BID) | ORAL | 0 refills | Status: DC

## 2016-11-01 NOTE — ED Provider Notes (Signed)
[facility] DEPT Provider Note   CSN: [id number] Arrival date & time: 10/31/16  2221     History   Chief Complaint Chief Complaint  Patient presents with  . Urinary Frequency  . Urinary Tract Infection    HPI Christina Bowen is a 30 y.o. female who presents to the ED with urinary frequency. She reports a bad odor to her urine. She states that if the urine is negative she wants to be tested for BV and STD's.   The history is provided by the patient.  Urinary Frequency  The current episode started more than 2 days ago. The problem has been gradually worsening. Pertinent negatives include no chest pain, no abdominal pain and no shortness of breath.  Urinary Tract Infection   Associated symptoms include frequency. Pertinent negatives include no chills, no nausea and no vomiting.    Past Medical History:  Diagnosis Date  . HPV (human papilloma virus) anogenital infection   . LGSIL (low grade squamous intraepithelial dysplasia)   . Vitamin D deficiency disease     There are no active problems to display for this patient.   History reviewed. No pertinent surgical history.  OB History    Gravida Para Term Preterm AB Living   0 0 0 0 0 0   SAB TAB Ectopic Multiple Live Births   0 0 0 0         Home Medications    Prior to Admission medications   Medication Sig Start Date End Date Taking? Authorizing Provider  famotidine (PEPCID) 20 MG tablet Take 1 tablet (20 mg total) by mouth 2 (two) times daily. 08/29/16   [place], NP  fluconazole (DIFLUCAN) 150 MG tablet Take 1 tablet (150 mg total) by mouth once. Can take additional dose three days later if symptoms persist Patient not taking: Reported on 07/02/2016 05/31/16   Melody N Shambley, CNM  hydrOXYzine (ATARAX/VISTARIL) 25 MG tablet Take 1 tablet (25 mg total) by mouth every 8 (eight) hours as needed. 08/29/16   Ryelynn Guedea Bunnie Pion, NP  ibuprofen (ADVIL,MOTRIN) 800 MG tablet Take 1 tablet (800 mg total) by mouth every 8  (eight) hours as needed. Patient not taking: Reported on 07/02/2016 06/10/15   Nance Pear, MD  metroNIDAZOLE (FLAGYL) 500 MG tablet Take 1 tablet (500 mg total) by mouth 2 (two) times daily. 11/01/16   Dannell Raczkowski Bunnie Pion, NP  Norethindrone-Ethinyl Estradiol-Fe (GENERESSE) 0.8-25 MG-MCG tablet Chew 1 tablet by mouth daily. Patient not taking: Reported on 07/02/2016 10/26/15   Melody N Shambley, CNM  predniSONE (DELTASONE) 10 MG tablet Take 2 tablets (20 mg total) by mouth 2 (two) times daily with a meal. 08/29/16   Fonnie Crookshanks Bunnie Pion, NP  tinidazole (TINDAMAX) 500 MG tablet Take 1 tablet (500 mg total) by mouth daily with breakfast. Patient not taking: Reported on 07/02/2016 03/29/16   Melody N Shambley, CNM  triamcinolone cream (KENALOG) 0.1 % APPLY 1 APPLICATION TOPICALLY 2 (TWO) TIMES DAILY. 09/27/16   Melody N Shambley, CNM  Vitamin D, Ergocalciferol, (DRISDOL) 50000 UNITS CAPS capsule Take 1 capsule by mouth 2 (two) times a week. 03/31/15   Historical Provider, MD    Family History Family History  Problem Relation Age of Onset  . Diabetes Father   . Heart disease Maternal Grandmother     Social History Social History  Substance Use Topics  . Smoking status: Former Research scientist (life sciences)  . Smokeless tobacco: Never Used  . Alcohol use Yes     Allergies  Patient has no known allergies.   Review of Systems Review of Systems  Constitutional: Negative for chills and fever.  HENT: Negative.   Eyes: Negative for visual disturbance.  Respiratory: Negative for cough and shortness of breath.   Cardiovascular: Negative for chest pain.  Gastrointestinal: Negative for abdominal pain, nausea and vomiting.  Genitourinary: Positive for frequency and vaginal discharge.  Musculoskeletal: Negative for back pain and neck pain.  Psychiatric/Behavioral: Negative for confusion.     Physical Exam Updated Vital Signs BP 117/67 (BP Location: Left Arm)   Pulse 85   Temp 97.7 F (36.5 C) (Oral)   Resp 20   LMP  10/24/2016   SpO2 100%   Physical Exam  Constitutional: She is oriented to person, place, and time. She appears well-developed and well-nourished. No distress.  HENT:  Head: Normocephalic and atraumatic.  Eyes: EOM are normal.  Neck: Neck supple.  Cardiovascular: Normal rate.   Pulmonary/Chest: Effort normal.  Abdominal: Soft. There is no tenderness.  Genitourinary:  Genitourinary Comments: External genitalia without lesions, frothy, malodorous d/c vaginal vault. No CMT, no adnexal tenderness.   Musculoskeletal: Normal range of motion.  Neurological: She is alert and oriented to person, place, and time. No cranial nerve deficit.  Skin: Skin is warm and dry.  Psychiatric: She has a normal mood and affect. Her behavior is normal.  Nursing note and vitals reviewed.    ED Treatments / Results  Labs (all labs ordered are listed, but only abnormal results are displayed) Labs Reviewed  WET PREP, GENITAL - Abnormal; Notable for the following:       Result Value   Clue Cells Wet Prep HPF POC PRESENT (*)    WBC, Wet Prep HPF POC MODERATE (*)    All other components within normal limits  URINALYSIS, ROUTINE W REFLEX MICROSCOPIC (NOT AT [facility])  RPR  HIV ANTIBODY (ROUTINE TESTING)  POC URINE PREG, ED  GC/CHLAMYDIA PROBE AMP ([place]) NOT AT [facility] - Closed    Radiology No results found.  Procedures Procedures (including critical care time)  Medications Ordered in ED Medications - No data to display   Initial Impression / Assessment and Plan / ED Course  I have reviewed the triage vital signs and the nursing notes.  Pertinent lab results that were available during my care of the patient were reviewed by me and considered in my medical decision making (see chart for details).  Clinical Course   30 y.o. female with frequent urination and vaginal d/c with odor stable for d/c without abdominal pain, fever, n/v or other problems. Will treat for BV and she will f/u with [facility] as needed.    Final Clinical Impressions(s) / ED Diagnoses   Final diagnoses:  BV (bacterial vaginosis)    New Prescriptions New Prescriptions   METRONIDAZOLE (FLAGYL) 500 MG TABLET    Take 1 tablet (500 mg total) by mouth 2 (two) times daily.     [facility] Bunnie Pion, NP 11/01/16 [id number]    Everlene Balls, MD 11/01/16 [phone]

## 2016-11-08 ENCOUNTER — Encounter: Payer: Managed Care, Other (non HMO) | Admitting: Obstetrics and Gynecology

## 2016-11-28 ENCOUNTER — Encounter (HOSPITAL_COMMUNITY): Payer: Self-pay | Admitting: *Deleted

## 2016-11-28 ENCOUNTER — Emergency Department (HOSPITAL_COMMUNITY)
Admission: EM | Admit: 2016-11-28 | Discharge: 2016-11-28 | Disposition: A | Discharge status: Home / Self Care | Payer: Managed Care, Other (non HMO) | Attending: Emergency Medicine | Admitting: Emergency Medicine

## 2016-11-28 DIAGNOSIS — Z79899 Other long term (current) drug therapy: Secondary | ICD-10-CM | POA: Insufficient documentation

## 2016-11-28 DIAGNOSIS — L509 Urticaria, unspecified: Secondary | ICD-10-CM | POA: Insufficient documentation

## 2016-11-28 DIAGNOSIS — Z87891 Personal history of nicotine dependence: Secondary | ICD-10-CM | POA: Insufficient documentation

## 2016-11-28 DIAGNOSIS — R0981 Nasal congestion: Secondary | ICD-10-CM | POA: Insufficient documentation

## 2016-11-28 MED ORDER — RANITIDINE HCL 150 MG PO TABS: 150.0000 mg | ORAL_TABLET | Freq: Two times a day (BID) | ORAL | 0 refills | Status: DC

## 2016-11-28 MED ORDER — PREDNISONE 20 MG PO TABS: 40.0000 mg | ORAL_TABLET | Freq: Every day | ORAL | 0 refills | Status: DC

## 2016-11-28 MED ORDER — PREDNISONE 20 MG PO TABS
60.0000 mg | ORAL_TABLET | Freq: Once | ORAL | Status: AC
Start: 2016-11-28 — End: 2016-11-28
  Administered 2016-11-28: 60 mg via ORAL
  Filled 2016-11-28: qty 3

## 2016-11-28 MED ORDER — FAMOTIDINE 20 MG PO TABS
20.0000 mg | ORAL_TABLET | Freq: Once | ORAL | Status: AC
Start: 2016-11-28 — End: 2016-11-28
  Administered 2016-11-28: 20 mg via ORAL
  Filled 2016-11-28: qty 1

## 2016-11-28 NOTE — Discharge Instructions (Signed)
Please take the prednisone once a day for 2 days starting tomorrow. You may also take the zantac up to 2 times day. I would also like for you to try mucinex D expectorant and nasal decongestant to help with sinus congestion. You may also you saline washes. It is likely a viral infection. If the symptoms last longer than 10 days return to the ED for antibiotics. You may take ibuprofen and Tylenol for pain and fevers as needed.

## 2016-11-28 NOTE — ED Provider Notes (Signed)
[facility] DEPT Provider Note   CSN: [id number] Arrival date & time: 11/28/16  0040     History   Chief Complaint Chief Complaint  Patient presents with  . Urticaria  . Sinus drainage    HPI Christina Bowen is a 30 y.o. female.   Urticaria  This is a new problem. The current episode started less than 1 hour ago (On abd, bilat arms, and back). The problem occurs constantly. The problem has been gradually improving. Pertinent negatives include no chest pain, no abdominal pain, no headaches and no shortness of breath. Associated symptoms comments: Pruritis. Denies SOB or difficulty breathing. . Nothing (Denies any new foods, medications, or soaps/lotions) aggravates the symptoms. Relieved by: Benadryl. The treatment provided moderate relief.  URI   This is a new problem. The current episode started less than 1 hour ago. The problem has been gradually worsening. There has been no fever. Associated symptoms include congestion, ear pain, rhinorrhea, sinus pain, sore throat and cough. Pertinent negatives include no chest pain, no abdominal pain, no diarrhea, no nausea, no vomiting and no headaches. She has tried nothing for the symptoms. The treatment provided no relief.    Past Medical History:  Diagnosis Date  . HPV (human papilloma virus) anogenital infection   . LGSIL (low grade squamous intraepithelial dysplasia)   . Vitamin D deficiency disease     There are no active problems to display for this patient.   History reviewed. No pertinent surgical history.  OB History    Gravida Para Term Preterm AB Living   0 0 0 0 0 0   SAB TAB Ectopic Multiple Live Births   0 0 0 0         Home Medications    Prior to Admission medications   Medication Sig Start Date End Date Taking? Authorizing Provider  famotidine (PEPCID) 20 MG tablet Take 1 tablet (20 mg total) by mouth 2 (two) times daily. 08/29/16   [place], NP  fluconazole (DIFLUCAN) 150 MG tablet Take 1 tablet  (150 mg total) by mouth once. Can take additional dose three days later if symptoms persist Patient not taking: Reported on 07/02/2016 05/31/16   Melody N Shambley, CNM  hydrOXYzine (ATARAX/VISTARIL) 25 MG tablet Take 1 tablet (25 mg total) by mouth every 8 (eight) hours as needed. 08/29/16   [place] Bunnie Pion, NP  ibuprofen (ADVIL,MOTRIN) 800 MG tablet Take 1 tablet (800 mg total) by mouth every 8 (eight) hours as needed. Patient not taking: Reported on 07/02/2016 06/10/15   Nance Pear, MD  metroNIDAZOLE (FLAGYL) 500 MG tablet Take 1 tablet (500 mg total) by mouth 2 (two) times daily. 11/01/16   [place] Bunnie Pion, NP  Norethindrone-Ethinyl Estradiol-Fe (GENERESSE) 0.8-25 MG-MCG tablet Chew 1 tablet by mouth daily. Patient not taking: Reported on 07/02/2016 10/26/15   Melody N Shambley, CNM  predniSONE (DELTASONE) 20 MG tablet Take 2 tablets (40 mg total) by mouth daily with breakfast. 11/28/16   Doristine Devoid, PA-C  ranitidine (ZANTAC) 150 MG tablet Take 1 tablet (150 mg total) by mouth 2 (two) times daily. 11/28/16   Doristine Devoid, PA-C  tinidazole (TINDAMAX) 500 MG tablet Take 1 tablet (500 mg total) by mouth daily with breakfast. Patient not taking: Reported on 07/02/2016 03/29/16   Melody N Shambley, CNM  triamcinolone cream (KENALOG) 0.1 % APPLY 1 APPLICATION TOPICALLY 2 (TWO) TIMES DAILY. 09/27/16   Melody N Shambley, CNM  Vitamin D, Ergocalciferol, (DRISDOL) 50000 UNITS CAPS capsule  Take 1 capsule by mouth 2 (two) times a week. 03/31/15   Historical Provider, MD    Family History Family History  Problem Relation Age of Onset  . Diabetes Father   . Heart disease Maternal Grandmother     Social History Social History  Substance Use Topics  . Smoking status: Former Research scientist (life sciences)  . Smokeless tobacco: Never Used  . Alcohol use Yes     Allergies   Patient has no known allergies.   Review of Systems Review of Systems  Constitutional: Negative for chills and fever.  HENT: Positive for  congestion, ear pain, rhinorrhea, sinus pain and sore throat.   Respiratory: Positive for cough. Negative for shortness of breath.   Cardiovascular: Negative for chest pain.  Gastrointestinal: Negative for abdominal pain, diarrhea, nausea and vomiting.  Neurological: Negative for headaches.  All other systems reviewed and are negative.    Physical Exam Updated Vital Signs BP 143/93 (BP Location: Right Arm)   Pulse 92   Temp 98 F (36.7 C) (Oral)   Resp 18   Ht 5\' 10"  (1.778 m)   Wt (!) 145.2 kg   LMP 11/17/2016   SpO2 99%   BMI 45.92 kg/m   Physical Exam  Constitutional: She appears well-developed and well-nourished. No distress.  HENT:  Head: Normocephalic and atraumatic.  Right Ear: Tympanic membrane, external ear and ear canal normal.  Left Ear: Tympanic membrane, external ear and ear canal normal.  Nose: Mucosal edema and rhinorrhea present. Right sinus exhibits maxillary sinus tenderness and frontal sinus tenderness. Left sinus exhibits maxillary sinus tenderness and frontal sinus tenderness.  Mouth/Throat: Uvula is midline, oropharynx is clear and moist and mucous membranes are normal. No oropharyngeal exudate, posterior oropharyngeal edema or posterior oropharyngeal erythema. Tonsils are 1+ on the right. Tonsils are 1+ on the left. No tonsillar exudate.  Patient talking in complete sentences. She is managing her secretions and maintaining her airway. Oropharynx is clear.  Eyes: Right eye exhibits no discharge. Left eye exhibits no discharge. No scleral icterus.  Neck: Normal range of motion. Neck supple.  Cardiovascular: Normal rate and regular rhythm.   Pulmonary/Chest: Effort normal and breath sounds normal. No respiratory distress.  lungs clear to ascultation bilatearlly  Musculoskeletal: Normal range of motion.  Lymphadenopathy:    She has no cervical adenopathy.  Neurological: She is alert.  Skin: Skin is warm and dry. Capillary refill takes less than 2 seconds.  No pallor.  Raised erythematous, urticaria like rash to the abdomen, bilaterally arm, and back that is pruritic with excoriations.  Nursing note and vitals reviewed.    ED Treatments / Results  Labs (all labs ordered are listed, but only abnormal results are displayed) Labs Reviewed - No data to display  EKG  EKG Interpretation None       Radiology No results found.  Procedures Procedures (including critical care time)  Medications Ordered in ED Medications  famotidine (PEPCID) tablet 20 mg (20 mg Oral Given 11/28/16 0145)  predniSONE (DELTASONE) tablet 60 mg (60 mg Oral Given 11/28/16 0145)     Initial Impression / Assessment and Plan / ED Course  I have reviewed the triage vital signs and the nursing notes.  Pertinent labs & imaging results that were available during my care of the patient were reviewed by me and considered in my medical decision making (see chart for details).  Clinical Course   Patient with urticarial eruption. No specific trigger. Discussed that urticaria can be trigger by stress heat  cold and for unknown reasons. No signs of anaphylactic reaction; no new medications. Patient managing her secretions and maintaining her airway. Will treat with prednisone and pepcid. Follow up with PCP in 2-3 days. Patients symptoms are consistent with URI, likely viral etiology. Lungs CTAB no indications for CXR. Discussed that antibiotics are not indicated for viral infections. Pt will be discharged with symptomatic treatment.  Pt is hemodynamically stable, in NAD, & able to ambulate in the ED. Pain has been managed & has no complaints prior to dc. Pt is comfortable with above plan and is stable for discharge at this time. All questions were answered prior to disposition. Strict return precautions for f/u to the ED were discussed.       Final Clinical Impressions(s) / ED Diagnoses   Final diagnoses:  Urticaria  Sinus congestion    New Prescriptions Discharge  Medication List as of 11/28/2016  1:42 AM    START taking these medications   Details  ranitidine (ZANTAC) 150 MG tablet Take 1 tablet (150 mg total) by mouth 2 (two) times daily., Starting Wed 11/28/2016, Print         Doristine Devoid, PA-C 11/28/16 [place], MD 11/29/16 [phone]

## 2016-11-28 NOTE — ED Triage Notes (Signed)
Patient presents with hives (1 on back, arm, abd) and sinus drainage

## 2016-12-21 ENCOUNTER — Other Ambulatory Visit: Payer: Self-pay | Admitting: Obstetrics and Gynecology

## 2017-01-03 ENCOUNTER — Telehealth: Payer: Self-pay | Admitting: Obstetrics and Gynecology

## 2017-01-03 ENCOUNTER — Other Ambulatory Visit: Payer: Self-pay | Admitting: *Deleted

## 2017-01-03 MED ORDER — METRONIDAZOLE 500 MG PO TABS: 500.0000 mg | ORAL_TABLET | Freq: Two times a day (BID) | ORAL | 0 refills | Status: DC

## 2017-01-03 NOTE — Telephone Encounter (Signed)
Done-ac 

## 2017-01-03 NOTE — Telephone Encounter (Signed)
thjis pt called and asked for metronitizole be refilled. She called the pharmacy and they haven't a request yet. Jose Persia on [address] [employer]

## 2017-01-23 ENCOUNTER — Telehealth: Payer: Self-pay | Admitting: Obstetrics and Gynecology

## 2017-01-23 NOTE — Telephone Encounter (Signed)
Pt called saying she thinks she has a UTI.  She does not have insurance right now.  She would like you to call something in to [facility]

## 2017-01-24 ENCOUNTER — Other Ambulatory Visit: Payer: Self-pay | Admitting: Obstetrics and Gynecology

## 2017-01-24 NOTE — Telephone Encounter (Signed)
Left detailed message for pt 

## 2017-01-24 NOTE — Telephone Encounter (Signed)
Pt calling back regarding symptoms of a UTI.  Pt does not have insurance.  Please call back at [phone]  Thanks teri

## 2017-02-06 NOTE — Telephone Encounter (Signed)
Spoke with pt

## 2017-02-06 NOTE — Telephone Encounter (Signed)
Patient called stating that she hadn't heard anything back from anyone. Patient was made aware that a vm was left for her with the information on it. She said that she didn't get the vm and was wondering if she could be called again and left another message. Number on file was verified as the correct phone number for the pt. Please advise.

## 2017-02-26 ENCOUNTER — Encounter (HOSPITAL_COMMUNITY): Payer: Self-pay | Admitting: Emergency Medicine

## 2017-02-26 ENCOUNTER — Emergency Department (HOSPITAL_COMMUNITY)
Admission: EM | Admit: 2017-02-26 | Discharge: 2017-02-27 | Disposition: A | Discharge status: Home / Self Care | Payer: Managed Care, Other (non HMO) | Attending: Emergency Medicine | Admitting: Emergency Medicine

## 2017-02-26 DIAGNOSIS — R300 Dysuria: Secondary | ICD-10-CM

## 2017-02-26 DIAGNOSIS — Z87891 Personal history of nicotine dependence: Secondary | ICD-10-CM | POA: Insufficient documentation

## 2017-02-26 LAB — URINALYSIS, ROUTINE W REFLEX MICROSCOPIC
Bilirubin Urine: NEGATIVE
Glucose, UA: NEGATIVE mg/dL
Hgb urine dipstick: NEGATIVE
Ketones, ur: NEGATIVE mg/dL
Nitrite: NEGATIVE
PH: 6 (ref 5.0–8.0)
Protein, ur: NEGATIVE mg/dL
SPECIFIC GRAVITY, URINE: 1.024 (ref 1.005–1.030)
SQUAMOUS EPITHELIAL / LPF: NONE SEEN

## 2017-02-26 NOTE — ED Notes (Signed)
Pt is aware that she is waiting room

## 2017-02-26 NOTE — ED Triage Notes (Signed)
Pt presents to ER with painful urination, frequency, urgency since 10 days ago after intercourse; pt also concerned for STD; pt denies vaginal discharge, vaginal pain, vaginal lesions

## 2017-02-26 NOTE — ED Notes (Signed)
Delay in lab draw,  Pt not in room 

## 2017-02-27 LAB — RPR: RPR Ser Ql: NONREACTIVE

## 2017-02-27 LAB — WET PREP, GENITAL
Clue Cells Wet Prep HPF POC: NONE SEEN
SPERM: NONE SEEN
Trich, Wet Prep: NONE SEEN
YEAST WET PREP: NONE SEEN

## 2017-02-27 LAB — HIV ANTIBODY (ROUTINE TESTING W REFLEX): HIV SCREEN 4TH GENERATION: NONREACTIVE

## 2017-02-27 MED ORDER — PHENAZOPYRIDINE HCL 200 MG PO TABS: 200.0000 mg | ORAL_TABLET | Freq: Three times a day (TID) | ORAL | 0 refills | Status: DC

## 2017-02-27 NOTE — Discharge Instructions (Signed)
Pelvic exam, and urine show no sign of Pyridium, this we'll make you urine orange, but is a bladder, anesthetic.  For symptom relief.  If you develop new or worsening symptoms please return to your primary care physician or for further evaluation.  Cultures and also been taken.  If any of them are positive, he will be notified to return for treatment infection.  You have been given a medication called

## 2017-02-27 NOTE — ED Provider Notes (Signed)
[facility] DEPT Provider Note   CSN: [id number] Arrival date & time: 02/26/17  1951     History   Chief Complaint Chief Complaint  Patient presents with  . Dysuria  . Exposure to STD    HPI Christina Bowen is a 31 y.o. female.  This a 31 year old female reports a pinching sensation at the end of her urinary stream for the past several days.  Denies any vaginal discharge, frequency or urgency. No headache, or fever      Past Medical History:  Diagnosis Date  . HPV (human papilloma virus) anogenital infection   . LGSIL (low grade squamous intraepithelial dysplasia)   . Vitamin D deficiency disease     There are no active problems to display for this patient.   History reviewed. No pertinent surgical history.  OB History    Gravida Para Term Preterm AB Living   0 0 0 0 0 0   SAB TAB Ectopic Multiple Live Births   0 0 0 0         Home Medications    Prior to Admission medications   Medication Sig Start Date End Date Taking? Authorizing Provider  famotidine (PEPCID) 20 MG tablet Take 1 tablet (20 mg total) by mouth 2 (two) times daily. 08/29/16   [place], NP  fluconazole (DIFLUCAN) 150 MG tablet Take 1 tablet (150 mg total) by mouth once. Can take additional dose three days later if symptoms persist Patient not taking: Reported on 07/02/2016 05/31/16   Melody N Shambley, CNM  hydrOXYzine (ATARAX/VISTARIL) 25 MG tablet Take 1 tablet (25 mg total) by mouth every 8 (eight) hours as needed. 08/29/16   Hope Bunnie Pion, NP  ibuprofen (ADVIL,MOTRIN) 800 MG tablet Take 1 tablet (800 mg total) by mouth every 8 (eight) hours as needed. Patient not taking: Reported on 07/02/2016 06/10/15   Nance Pear, MD  metroNIDAZOLE (FLAGYL) 500 MG tablet TAKE 1 TABLET(500 MG) BY MOUTH TWICE DAILY 01/24/17   Melody N Shambley, CNM  Norethindrone-Ethinyl Estradiol-Fe (GENERESSE) 0.8-25 MG-MCG tablet Chew 1 tablet by mouth daily. Patient not taking: Reported on 07/02/2016 10/26/15    Melody N Shambley, CNM  phenazopyridine (PYRIDIUM) 200 MG tablet Take 1 tablet (200 mg total) by mouth 3 (three) times daily. 02/27/17   Junius Creamer, NP  predniSONE (DELTASONE) 20 MG tablet Take 2 tablets (40 mg total) by mouth daily with breakfast. 11/28/16   Doristine Devoid, PA-C  ranitidine (ZANTAC) 150 MG tablet Take 1 tablet (150 mg total) by mouth 2 (two) times daily. 11/28/16   Doristine Devoid, PA-C  tinidazole (TINDAMAX) 500 MG tablet Take 1 tablet (500 mg total) by mouth daily with breakfast. Patient not taking: Reported on 07/02/2016 03/29/16   Melody N Shambley, CNM  triamcinolone cream (KENALOG) 0.1 % APPLY 1 APPLICATION TOPICALLY 2 (TWO) TIMES DAILY. 09/27/16   Melody N Shambley, CNM  Vitamin D, Ergocalciferol, (DRISDOL) 50000 UNITS CAPS capsule Take 1 capsule by mouth 2 (two) times a week. 03/31/15   Historical Provider, MD    Family History Family History  Problem Relation Age of Onset  . Diabetes Father   . Heart disease Maternal Grandmother     Social History Social History  Substance Use Topics  . Smoking status: Former Research scientist (life sciences)  . Smokeless tobacco: Never Used  . Alcohol use Yes     Allergies   Patient has no known allergies.   Review of Systems Review of Systems  Constitutional: Negative for fever.  Gastrointestinal: Negative for abdominal pain.  Genitourinary: Positive for dysuria. Negative for frequency, hematuria and urgency.  Neurological: Negative for headaches.  All other systems reviewed and are negative.    Physical Exam Updated Vital Signs BP (!) 158/89 (BP Location: Right Arm)   Pulse 89   Temp 97.6 F (36.4 C) (Oral)   Resp (!) 24   Ht 5\' 10"  (1.778 m)   Wt (!) 175.1 kg   LMP 01/03/2017   SpO2 99%   BMI 55.39 kg/m   Physical Exam  Constitutional: She appears well-developed and well-nourished.  HENT:  Head: Normocephalic.  Eyes: Pupils are equal, round, and reactive to light.  Neck: Normal range of motion.  Cardiovascular:  Normal rate.   Abdominal: Soft.  Genitourinary: Vagina normal. Cervix exhibits no discharge. Right adnexum displays no tenderness. Left adnexum displays no tenderness. No vaginal discharge found.  Genitourinary Comments: Physical exam immediate by body habitus  Neurological: She is alert.  Skin: Skin is warm.  Nursing note and vitals reviewed.    ED Treatments / Results  Labs (all labs ordered are listed, but only abnormal results are displayed) Labs Reviewed  WET PREP, GENITAL - Abnormal; Notable for the following:       Result Value   WBC, Wet Prep HPF POC MANY (*)    All other components within normal limits  URINALYSIS, ROUTINE W REFLEX MICROSCOPIC - Abnormal; Notable for the following:    APPearance HAZY (*)    Leukocytes, UA TRACE (*)    Bacteria, UA RARE (*)    All other components within normal limits  HIV ANTIBODY (ROUTINE TESTING)  RPR  POC URINE PREG, ED  GC/CHLAMYDIA PROBE AMP ([place]) NOT AT [facility]    EKG  EKG Interpretation None       Radiology No results found.  Procedures Procedures (including critical care time)  Medications Ordered in ED Medications - No data to display   Initial Impression / Assessment and Plan / ED Course  I have reviewed the triage vital signs and the nursing notes.  Pertinent labs & imaging results that were available during my care of the patient were reviewed by me and considered in my medical decision making (see chart for details).      Urinating wet prep reveal no source for the patient's discomfort.  She'll be treated with Pyridium for dysuria.  Cultures have been obtained and patient will be notified if positive for GC, chlamydia  Final Clinical Impressions(s) / ED Diagnoses   Final diagnoses:  Dysuria    New Prescriptions New Prescriptions   PHENAZOPYRIDINE (PYRIDIUM) 200 MG TABLET    Take 1 tablet (200 mg total) by mouth 3 (three) times daily.     Junius Creamer, NP 02/27/17 [id number]    Sherwood Gambler,  MD 03/02/17 [phone]

## 2017-02-28 LAB — GC/CHLAMYDIA PROBE AMP (~~LOC~~) NOT AT ARMC
Chlamydia: NEGATIVE
Neisseria Gonorrhea: NEGATIVE

## 2017-03-04 ENCOUNTER — Other Ambulatory Visit: Payer: Self-pay | Admitting: Obstetrics and Gynecology

## 2017-03-17 ENCOUNTER — Other Ambulatory Visit: Payer: Self-pay | Admitting: Obstetrics and Gynecology

## 2017-04-02 ENCOUNTER — Encounter: Payer: Managed Care, Other (non HMO) | Admitting: Obstetrics and Gynecology

## 2017-04-03 ENCOUNTER — Encounter: Payer: Managed Care, Other (non HMO) | Admitting: Obstetrics and Gynecology

## 2017-04-13 ENCOUNTER — Other Ambulatory Visit: Payer: Self-pay | Admitting: Obstetrics and Gynecology

## 2017-04-30 ENCOUNTER — Encounter: Payer: Self-pay | Admitting: Obstetrics and Gynecology

## 2017-04-30 ENCOUNTER — Other Ambulatory Visit: Payer: Self-pay | Admitting: Obstetrics and Gynecology

## 2017-04-30 ENCOUNTER — Ambulatory Visit (INDEPENDENT_AMBULATORY_CARE_PROVIDER_SITE_OTHER): Payer: PRIVATE HEALTH INSURANCE | Admitting: Obstetrics and Gynecology

## 2017-04-30 VITALS — BP 112/69 | HR 78 | Ht 70.0 in | Wt 391.4 lb

## 2017-04-30 DIAGNOSIS — Z01411 Encounter for gynecological examination (general) (routine) with abnormal findings: Secondary | ICD-10-CM | POA: Diagnosis not present

## 2017-04-30 DIAGNOSIS — E559 Vitamin D deficiency, unspecified: Secondary | ICD-10-CM

## 2017-04-30 NOTE — Progress Notes (Signed)
   Subjective:     Christina Bowen is a black single 31 y.o. female and is here for a comprehensive physical exam. 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 The patient reports no problems.  Social History   Social History  . Marital status: Single    Spouse name: N/A  . Number of children: N/A  . Years of education: N/A   Occupational History  . Not on file.   Social History Main Topics  . Smoking status: Former Research scientist (life sciences)  . Smokeless tobacco: Never Used  . Alcohol use Yes  . Drug use: No  . Sexual activity: Yes   Other Topics Concern  . Not on file   Social History Narrative  . No narrative on file   Health Maintenance  Topic Date Due  . TETANUS/TDAP  08/15/2007  . INFLUENZA VACCINE  07/03/2017  . PAP SMEAR  03/31/2019  . HIV Screening  Completed    The following portions of the patient's history were reviewed and updated as appropriate: allergies, current medications, past family history, past medical history, past social history, past surgical history and problem list.  Review of Systems A comprehensive review of systems was negative.   Objective:    General appearance: alert, cooperative and appears stated age Neck: no adenopathy, no carotid bruit, no JVD, supple, symmetrical, trachea midline and thyroid not enlarged, symmetric, no tenderness/mass/nodules Lungs: clear to auscultation bilaterally Breasts: normal appearance, no masses or tenderness Heart: regular rate and rhythm, S1, S2 normal, no murmur, click, rub or gallop Abdomen: soft, non-tender; bowel sounds normal; no masses,  no organomegaly Pelvic: cervix normal in appearance, external genitalia normal, no adnexal masses or tenderness, no cervical motion tenderness, rectovaginal septum normal, uterus normal size, shape, and consistency and vagina normal without discharge    Assessment:    Healthy female exam. Morbid obesity, previous abnormal pasp     Plan:  Pap and screening labs obtianed.  Desires weight loss plan- ill return within next week to start. Otherwise RTC 1 year or as needed.  Kanye Depree CNM   See After Visit Summary for Counseling Recommendations

## 2017-04-30 NOTE — Patient Instructions (Signed)
 Preventive Care 18-39 Years, Female Preventive care refers to lifestyle choices and visits with your health care provider that can promote health and wellness. What does preventive care include?  A yearly physical exam. This is also called an annual well check.  Dental exams once or twice a year.  Routine eye exams. Ask your health care provider how often you should have your eyes checked.  Personal lifestyle choices, including:  Daily care of your teeth and gums.  Regular physical activity.  Eating a healthy diet.  Avoiding tobacco and drug use.  Limiting alcohol use.  Practicing safe sex.  Taking vitamin and mineral supplements as recommended by your health care provider. What happens during an annual well check? The services and screenings done by your health care provider during your annual well check will depend on your age, overall health, lifestyle risk factors, and family history of disease. Counseling  Your health care provider may ask you questions about your:  Alcohol use.  Tobacco use.  Drug use.  Emotional well-being.  Home and relationship well-being.  Sexual activity.  Eating habits.  Work and work environment.  Method of birth control.  Menstrual cycle.  Pregnancy history. Screening  You may have the following tests or measurements:  Height, weight, and BMI.  Diabetes screening. This is done by checking your blood sugar (glucose) after you have not eaten for a while (fasting).  Blood pressure.  Lipid and cholesterol levels. These may be checked every 5 years starting at age 20.  Skin check.  Hepatitis C blood test.  Hepatitis B blood test.  Sexually transmitted disease (STD) testing.  BRCA-related cancer screening. This may be done if you have a family history of breast, ovarian, tubal, or peritoneal cancers.  Pelvic exam and Pap test. This may be done every 3 years starting at age 21. Starting at age 30, this may be done  every 5 years if you have a Pap test in combination with an HPV test. Discuss your test results, treatment options, and if necessary, the need for more tests with your health care provider. Vaccines  Your health care provider may recommend certain vaccines, such as:  Influenza vaccine. This is recommended every year.  Tetanus, diphtheria, and acellular pertussis (Tdap, Td) vaccine. You may need a Td booster every 10 years.  Varicella vaccine. You may need this if you have not been vaccinated.  HPV vaccine. If you are 26 or younger, you may need three doses over 6 months.  Measles, mumps, and rubella (MMR) vaccine. You may need at least one dose of MMR. You may also need a second dose.  Pneumococcal 13-valent conjugate (PCV13) vaccine. You may need this if you have certain conditions and were not previously vaccinated.  Pneumococcal polysaccharide (PPSV23) vaccine. You may need one or two doses if you smoke cigarettes or if you have certain conditions.  Meningococcal vaccine. One dose is recommended if you are age 19-21 years and a first-year college student living in a residence hall, or if you have one of several medical conditions. You may also need additional booster doses.  Hepatitis A vaccine. You may need this if you have certain conditions or if you travel or work in places where you may be exposed to hepatitis A.  Hepatitis B vaccine. You may need this if you have certain conditions or if you travel or work in places where you may be exposed to hepatitis B.  Haemophilus influenzae type b (Hib) vaccine. You may need   this if you have certain risk factors. Talk to your health care provider about which screenings and vaccines you need and how often you need them. This information is not intended to replace advice given to you by your health care provider. Make sure you discuss any questions you have with your health care provider. Document Released: 01/15/2002 Document Revised:  08/08/2016 Document Reviewed: 09/20/2015 Elsevier Interactive Patient Education  2017 [employer]  

## 2017-05-02 LAB — COMPREHENSIVE METABOLIC PANEL
ALBUMIN: 4.1 g/dL (ref 3.5–5.5)
ALT: 24 IU/L (ref 0–32)
AST: 17 IU/L (ref 0–40)
Albumin/Globulin Ratio: 1.5 (ref 1.2–2.2)
Alkaline Phosphatase: 46 IU/L (ref 39–117)
BILIRUBIN TOTAL: 0.3 mg/dL (ref 0.0–1.2)
BUN / CREAT RATIO: 13 (ref 9–23)
BUN: 9 mg/dL (ref 6–20)
CALCIUM: 9 mg/dL (ref 8.7–10.2)
CHLORIDE: 100 mmol/L (ref 96–106)
CO2: 25 mmol/L (ref 18–29)
Creatinine, Ser: 0.72 mg/dL (ref 0.57–1.00)
GFR, EST AFRICAN AMERICAN: 132 mL/min/{1.73_m2} (ref >59)
GFR, EST NON AFRICAN AMERICAN: 114 mL/min/{1.73_m2} (ref >59)
GLUCOSE: 91 mg/dL (ref 65–99)
Globulin, Total: 2.7 g/dL (ref 1.5–4.5)
Potassium: 4.3 mmol/L (ref 3.5–5.2)
Sodium: 137 mmol/L (ref 134–144)
TOTAL PROTEIN: 6.8 g/dL (ref 6.0–8.5)

## 2017-05-02 LAB — LIPID PANEL
Chol/HDL Ratio: 3.6 ratio (ref 0.0–4.4)
Cholesterol, Total: 170 mg/dL (ref 100–199)
HDL: 47 mg/dL (ref >39)
LDL Calculated: 82 mg/dL (ref 0–99)
TRIGLYCERIDES: 205 mg/dL — AB (ref 0–149)
VLDL Cholesterol Cal: 41 mg/dL — ABNORMAL HIGH (ref 5–40)

## 2017-05-02 LAB — HIV ANTIBODY (ROUTINE TESTING W REFLEX): HIV Screen 4th Generation wRfx: NONREACTIVE

## 2017-05-02 LAB — RPR: RPR Ser Ql: NONREACTIVE

## 2017-05-02 LAB — HSV 1 AND 2 IGM ABS, INDIRECT

## 2017-05-02 LAB — VITAMIN D 25 HYDROXY (VIT D DEFICIENCY, FRACTURES): Vit D, 25-Hydroxy: 15.4 ng/mL — ABNORMAL LOW (ref 30.0–100.0)

## 2017-05-02 LAB — CYTOLOGY - PAP

## 2017-05-02 LAB — HEMOGLOBIN A1C
Est. average glucose Bld gHb Est-mCnc: 97 mg/dL
Hgb A1c MFr Bld: 5 % (ref 4.8–5.6)

## 2017-05-03 ENCOUNTER — Encounter: Payer: PRIVATE HEALTH INSURANCE | Admitting: Obstetrics and Gynecology

## 2017-05-03 ENCOUNTER — Ambulatory Visit: Payer: PRIVATE HEALTH INSURANCE

## 2017-05-03 ENCOUNTER — Other Ambulatory Visit: Payer: Self-pay | Admitting: Obstetrics and Gynecology

## 2017-05-03 DIAGNOSIS — E559 Vitamin D deficiency, unspecified: Secondary | ICD-10-CM | POA: Insufficient documentation

## 2017-05-03 MED ORDER — VITAMIN D (ERGOCALCIFEROL) 1.25 MG (50000 UNIT) PO CAPS: 50000.0000 [IU] | ORAL_CAPSULE | ORAL | 3 refills | Status: DC

## 2017-05-20 ENCOUNTER — Other Ambulatory Visit: Payer: Self-pay | Admitting: Obstetrics and Gynecology

## 2017-05-21 ENCOUNTER — Other Ambulatory Visit: Payer: Self-pay | Admitting: Obstetrics and Gynecology

## 2017-05-31 ENCOUNTER — Ambulatory Visit: Payer: PRIVATE HEALTH INSURANCE | Admitting: Obstetrics and Gynecology

## 2017-06-16 ENCOUNTER — Other Ambulatory Visit: Payer: Self-pay | Admitting: Obstetrics and Gynecology

## 2017-06-19 ENCOUNTER — Other Ambulatory Visit: Payer: Self-pay | Admitting: Obstetrics and Gynecology

## 2017-06-23 ENCOUNTER — Other Ambulatory Visit: Payer: Self-pay | Admitting: Obstetrics and Gynecology

## 2017-06-28 ENCOUNTER — Other Ambulatory Visit: Payer: Self-pay | Admitting: Obstetrics and Gynecology

## 2017-06-28 ENCOUNTER — Ambulatory Visit: Payer: PRIVATE HEALTH INSURANCE | Admitting: Obstetrics and Gynecology

## 2017-07-02 ENCOUNTER — Other Ambulatory Visit: Payer: Self-pay | Admitting: Obstetrics and Gynecology

## 2017-08-08 ENCOUNTER — Other Ambulatory Visit: Payer: Self-pay | Admitting: Obstetrics and Gynecology

## 2017-08-11 ENCOUNTER — Encounter (HOSPITAL_COMMUNITY): Payer: Self-pay | Admitting: Emergency Medicine

## 2017-08-11 ENCOUNTER — Emergency Department (HOSPITAL_COMMUNITY)
Admission: EM | Admit: 2017-08-11 | Discharge: 2017-08-11 | Disposition: A | Discharge status: Home / Self Care | Payer: Self-pay | Attending: Emergency Medicine | Admitting: Emergency Medicine

## 2017-08-11 ENCOUNTER — Emergency Department (HOSPITAL_COMMUNITY): Payer: Self-pay

## 2017-08-11 DIAGNOSIS — F439 Reaction to severe stress, unspecified: Secondary | ICD-10-CM | POA: Insufficient documentation

## 2017-08-11 DIAGNOSIS — F419 Anxiety disorder, unspecified: Secondary | ICD-10-CM | POA: Insufficient documentation

## 2017-08-11 DIAGNOSIS — Z79899 Other long term (current) drug therapy: Secondary | ICD-10-CM | POA: Insufficient documentation

## 2017-08-11 DIAGNOSIS — R072 Precordial pain: Secondary | ICD-10-CM | POA: Insufficient documentation

## 2017-08-11 DIAGNOSIS — Z87891 Personal history of nicotine dependence: Secondary | ICD-10-CM | POA: Insufficient documentation

## 2017-08-11 LAB — URINALYSIS, ROUTINE W REFLEX MICROSCOPIC
BILIRUBIN URINE: NEGATIVE
Glucose, UA: NEGATIVE mg/dL
Hgb urine dipstick: NEGATIVE
KETONES UR: NEGATIVE mg/dL
Nitrite: NEGATIVE
PH: 6 (ref 5.0–8.0)
Protein, ur: NEGATIVE mg/dL
SPECIFIC GRAVITY, URINE: 1.024 (ref 1.005–1.030)

## 2017-08-11 LAB — CBC
HEMATOCRIT: 39.1 % (ref 36.0–46.0)
HEMOGLOBIN: 12.6 g/dL (ref 12.0–15.0)
MCH: 30.8 pg (ref 26.0–34.0)
MCHC: 32.2 g/dL (ref 30.0–36.0)
MCV: 95.6 fL (ref 78.0–100.0)
Platelets: 366 10*3/uL (ref 150–400)
RBC: 4.09 MIL/uL (ref 3.87–5.11)
RDW: 13.7 % (ref 11.5–15.5)
WBC: 10.6 10*3/uL — ABNORMAL HIGH (ref 4.0–10.5)

## 2017-08-11 LAB — BASIC METABOLIC PANEL
Anion gap: 6 (ref 5–15)
BUN: 6 mg/dL (ref 6–20)
CHLORIDE: 108 mmol/L (ref 101–111)
CO2: 23 mmol/L (ref 22–32)
Calcium: 8.5 mg/dL — ABNORMAL LOW (ref 8.9–10.3)
Creatinine, Ser: 0.66 mg/dL (ref 0.44–1.00)
GFR calc non Af Amer: >60 mL/min (ref >60)
Glucose, Bld: 89 mg/dL (ref 65–99)
Potassium: 4.2 mmol/L (ref 3.5–5.1)
Sodium: 137 mmol/L (ref 135–145)

## 2017-08-11 LAB — I-STAT TROPONIN, ED: TROPONIN I, POC: 0 ng/mL (ref 0.00–0.08)

## 2017-08-11 LAB — D-DIMER, QUANTITATIVE: D-Dimer, Quant: 0.39 ug/mL-FEU (ref 0.00–0.50)

## 2017-08-11 LAB — POC URINE PREG, ED: Preg Test, Ur: NEGATIVE

## 2017-08-11 MED ORDER — GI COCKTAIL ~~LOC~~
30.0000 mL | Freq: Once | ORAL | Status: AC
  Administered 2017-08-11: 30 mL via ORAL
  Filled 2017-08-11: qty 30

## 2017-08-11 MED ORDER — HYDROXYZINE HCL 50 MG PO TABS: 50.0000 mg | ORAL_TABLET | Freq: Four times a day (QID) | ORAL | 0 refills | Status: DC | PRN

## 2017-08-11 MED ORDER — LORAZEPAM 2 MG/ML IJ SOLN: 1.0000 mg | Freq: Once | INTRAMUSCULAR | Status: DC

## 2017-08-11 MED ORDER — HYDROXYZINE HCL 25 MG PO TABS
50.0000 mg | ORAL_TABLET | Freq: Once | ORAL | Status: AC
  Administered 2017-08-11: 50 mg via ORAL
  Filled 2017-08-11: qty 2

## 2017-08-11 NOTE — Discharge Instructions (Signed)
Your labs, xray, and EKG are all reassuring, your chest pain could be a variety of things including anxiety related, indigestion, gas pain, muscle pain, and other nonemergent issues. Take vistaril as directed as needed for anxiety. Stay well hydrated. You may consider using over the counter tums/maalox/zantac as needed for additional relief. Avoid spicy/fatty/acidic foods, avoid soda/coffee/tea/alcohol. Avoid laying down flat within 30 minutes of eating. Avoid NSAIDs like ibuprofen/aleve/motrin/etc on an empty stomach. Use tylenol as needed for pain. Follow up with your regular doctor in 5-7 days for recheck of symptoms. Return to the ER for changes or worsening symptoms.  SEEK IMMEDIATE MEDICAL ATTENTION IF: You develop a fever.  Your chest pains become severe or intolerable.  You develop new, unexplained symptoms (problems).  You develop shortness of breath, nausea, vomiting, sweating or feel light headed.  You develop a new cough or you cough up blood. You develop new leg swelling

## 2017-08-11 NOTE — ED Notes (Signed)
Pt updated to delay, lights in room dimmed. Call light within reach.

## 2017-08-11 NOTE — ED Provider Notes (Signed)
[facility] DEPT Provider Note   CSN: [id number] Arrival date & time: 08/11/17  1708     History   Chief Complaint Chief Complaint  Patient presents with  . Chest Pain    HPI Christina Bowen is a 31 y.o. female with a PMHx of morbid obesity, who presents to the ED with complaints of gradual onset chest pain that began last night around 7-8 PM while she was sitting doing her homework at rest. Patient is in nursing school and reports increased stress recently which is causing her to feel very anxious. She states that she thought that she was having a panic attack because last night she developed chest pain. She states that today the chest pain continued although has been gradually improving and better than it was yesterday. She describes the pain as 8/10 constant tightness in the center and right side of her chest, nonradiating, worse with lifting her arms or taking a deep breath, and with no treatments tried prior to arrival or alleviating factors noted. She also reports seeing orange spots in her throat and wants that to be evaluated. She states she has sinus congestion chronically due to allergies but denies any acute changes in this. She admits to fairly regular NSAID use and occasional EtOH use (1x/month). States she doesn't smoke cigarettes often, but admits that she smokes 1 cigarette "every once in a while, maybe every month or so"; denies regular cigarette smoking. +FHx of MI in mom (@54y /o, survived).   She denies diaphoresis, lightheadedness, fevers, chills, cough, sore throat, ear pain/drainage, rhinorrhea, SOB, LE swelling, recent travel/surgery/immobilization, estrogen use, personal/family hx of DVT/PE, abd pain, N/V/D/C, hematuria, dysuria, myalgias, arthralgias, claudication, orthopnea, numbness, tingling, focal weakness, recent wt changes, or any other complaints at this time. Denies hx of DM2, COPD/asthma, or any other chronic medical conditions aside from those listed above.     The history is provided by the patient and medical records. No language interpreter was used.  Chest Pain   This is a new problem. The current episode started yesterday. The problem occurs constantly. The problem has been gradually improving. The pain is associated with rest. The pain is present in the substernal region and lateral region. The pain is at a severity of 8/10. The pain is moderate. Quality: tightness. The pain does not radiate. Duration of episode(s) is 1 day. The symptoms are aggravated by deep breathing and certain positions. Pertinent negatives include no abdominal pain, no claudication, no cough, no diaphoresis, no fever, no lower extremity edema, no nausea, no numbness, no orthopnea, no shortness of breath, no vomiting and no weakness. She has tried nothing for the symptoms. The treatment provided no relief. Risk factors include obesity.  Pertinent negatives for past medical history include no COPD, no diabetes, no DVT, no hyperlipidemia, no hypertension and no PE.  Her family medical history is significant for CAD.  Pertinent negatives for family medical history include: no PE.    Past Medical History:  Diagnosis Date  . HPV (human papilloma virus) anogenital infection   . LGSIL (low grade squamous intraepithelial dysplasia)   . Vitamin D deficiency disease     Patient Active Problem List   Diagnosis Date Noted  . Vitamin D deficiency 05/03/2017  . Morbid obesity ([place]) 05/03/2017    No past surgical history on file.  OB History    Gravida Para Term Preterm AB Living   0 0 0 0 0 0   SAB TAB Ectopic Multiple Live Births  0 0 0 0         Home Medications    Prior to Admission medications   Medication Sig Start Date End Date Taking? Authorizing Provider  fluconazole (DIFLUCAN) 150 MG tablet Take 1 tablet (150 mg total) by mouth once. Can take additional dose three days later if symptoms persist Patient not taking: Reported on 07/02/2016 05/31/16   Shambley,  Melody N, CNM  hydrOXYzine (ATARAX/VISTARIL) 25 MG tablet Take 1 tablet (25 mg total) by mouth every 8 (eight) hours as needed. Patient not taking: Reported on 04/30/2017 08/29/16   Ashley Murrain, NP  ibuprofen (ADVIL,MOTRIN) 800 MG tablet Take 1 tablet (800 mg total) by mouth every 8 (eight) hours as needed. Patient not taking: Reported on 07/02/2016 06/10/15   Nance Pear, MD  metroNIDAZOLE (FLAGYL) 500 MG tablet TAKE 1 TABLET(500 MG) BY MOUTH TWICE DAILY 05/21/17   Shambley, Melody N, CNM  metroNIDAZOLE (FLAGYL) 500 MG tablet TAKE 1 TABLET(500 MG) BY MOUTH TWICE DAILY 05/21/17   Shambley, Melody N, CNM  metroNIDAZOLE (FLAGYL) 500 MG tablet TAKE 1 TABLET BY MOUTH TWICE DAILY 07/11/17   Shambley, Melody N, CNM  metroNIDAZOLE (FLAGYL) 500 MG tablet TAKE 1 TABLET BY MOUTH TWICE DAILY 08/08/17   Shambley, Melody N, CNM  Norethindrone-Ethinyl Estradiol-Fe (GENERESSE) 0.8-25 MG-MCG tablet Chew 1 tablet by mouth daily. Patient not taking: Reported on 07/02/2016 10/26/15   Joylene Igo, CNM  phenazopyridine (PYRIDIUM) 200 MG tablet Take 1 tablet (200 mg total) by mouth 3 (three) times daily. Patient not taking: Reported on 04/30/2017 02/27/17   Junius Creamer, NP  tinidazole (TINDAMAX) 500 MG tablet TAKE 1 TABLET BY MOUTH DAILY WITH BREAKFAST 05/22/17   Shambley, Melody N, CNM  triamcinolone cream (KENALOG) 0.1 % APPLY 1 APPLICATION TOPICALLY 2 (TWO) TIMES DAILY. Patient not taking: Reported on 04/30/2017 09/27/16   Joylene Igo, CNM  Vitamin D, Ergocalciferol, (DRISDOL) 50000 units CAPS capsule Take 1 capsule (50,000 Units total) by mouth 2 (two) times a week. 05/06/17   Joylene Igo, CNM    Family History Family History  Problem Relation Age of Onset  . Diabetes Father   . Heart disease Maternal Grandmother     Social History Social History  Substance Use Topics  . Smoking status: Former Research scientist (life sciences)  . Smokeless tobacco: Never Used  . Alcohol use Yes     Allergies   Patient has no known  allergies.   Review of Systems Review of Systems  Constitutional: Negative for chills, diaphoresis, fever and unexpected weight change.  HENT: Negative for ear discharge, ear pain, rhinorrhea and sore throat.        +orange spots in throat  Respiratory: Negative for cough and shortness of breath.   Cardiovascular: Positive for chest pain. Negative for orthopnea, claudication and leg swelling.  Gastrointestinal: Negative for abdominal pain, constipation, diarrhea, nausea and vomiting.  Genitourinary: Negative for dysuria and hematuria.  Musculoskeletal: Negative for arthralgias and myalgias.  Skin: Negative for color change.  Allergic/Immunologic: Negative for immunocompromised state.  Neurological: Negative for weakness, light-headedness and numbness.  Psychiatric/Behavioral: Negative for confusion. The patient is nervous/anxious.        +stress   All other systems reviewed and are negative for acute change except as noted in the HPI.    Physical Exam Updated Vital Signs BP (!) 144/97   Pulse 83   Temp 98 F (36.7 C)   Resp 19   LMP 07/16/2017   SpO2 100%   Physical Exam  Constitutional: She is oriented to person, place, and time. Vital signs are normal. She appears well-developed and well-nourished.  Non-toxic appearance. No distress.  Afebrile, nontoxic, slightly anxious appearing but overall in NAD  HENT:  Head: Normocephalic and atraumatic.  Nose: Mucosal edema present.  Mouth/Throat: Uvula is midline and mucous membranes are normal. No oral lesions. No trismus in the jaw. No uvula swelling. Posterior oropharyngeal erythema present. No oropharyngeal exudate, posterior oropharyngeal edema or tonsillar abscesses. Tonsils are 0 on the right. Tonsils are 0 on the left. No tonsillar exudate.  Nose mildly congested. Oropharynx very mildly injected with a few tiny submucosal hemorrhage spots on b/l tonsils and soft palate but no other lesions/spots noted, without uvular swelling or  deviation, no trismus or drooling, no tonsillar swelling, no exudates.    Eyes: Conjunctivae and EOM are normal. Right eye exhibits no discharge. Left eye exhibits no discharge.  Neck: Normal range of motion. Neck supple.  Cardiovascular: Normal rate, regular rhythm, normal heart sounds and intact distal pulses.  Exam reveals no gallop and no friction rub.   No murmur heard. RRR, nl s1/s2, no m/r/g, distal pulses intact, no overt pedal edema although pt's obesity slightly limits evaluation  Pulmonary/Chest: Effort normal and breath sounds normal. No respiratory distress. She has no decreased breath sounds. She has no wheezes. She has no rhonchi. She has no rales. She exhibits no tenderness, no crepitus, no deformity and no retraction.  CTAB in all lung fields, no w/r/r, no hypoxia or increased WOB, speaking in full sentences, SpO2 100% on RA Chest wall nonTTP without crepitus, deformities, or retractions  Abdominal: Soft. Normal appearance and bowel sounds are normal. She exhibits no distension. There is no tenderness. There is no rigidity, no rebound, no guarding, no CVA tenderness, no tenderness at McBurney's point and negative Murphy's sign.  Musculoskeletal: Normal range of motion.  MAE x4 Strength and sensation grossly intact in all extremities Distal pulses intact Gait steady No overt pedal edema but obesity limits exam slightly; neg homan's bilaterally  Neurological: She is alert and oriented to person, place, and time. She has normal strength. No sensory deficit.  Skin: Skin is warm, dry and intact. No rash noted.  Psychiatric: Her mood appears anxious.  Slightly anxious  Nursing note and vitals reviewed.    ED Treatments / Results  Labs (all labs ordered are listed, but only abnormal results are displayed) Labs Reviewed  BASIC METABOLIC PANEL - Abnormal; Notable for the following:       Result Value   Calcium 8.5 (*)    All other components within normal limits  CBC -  Abnormal; Notable for the following:    WBC 10.6 (*)    All other components within normal limits  D-DIMER, QUANTITATIVE (NOT AT [facility])  I-STAT TROPONIN, ED  POC URINE PREG, ED    EKG  EKG Interpretation  Date/Time:  Sunday August 11 2017 17:12:01 EDT Ventricular Rate:  78 PR Interval:  160 QRS Duration: 74 QT Interval:  386 QTC Calculation: 440 R Axis:   35 Text Interpretation:  Normal sinus rhythm with sinus arrhythmia Normal ECG Confirmed by Isla Pence [phone]) on 08/11/2017 7:25:45 PM       Radiology Dg Chest 2 View  Result Date: 08/11/2017 CLINICAL DATA:  Central chest pain starting today. EXAM: CHEST  2 VIEW COMPARISON:  None. FINDINGS: Normal heart size and mediastinal contours. No acute infiltrate or edema. No effusion or pneumothorax. No acute osseous findings. IMPRESSION: Negative chest. Electronically  Signed   By: Monte Fantasia M.D.   On: 08/11/2017 18:15    Procedures Procedures (including critical care time)  Medications Ordered in ED Medications  gi cocktail (Maalox,Lidocaine,Donnatal) (30 mLs Oral Given 08/11/17 1908)  hydrOXYzine (ATARAX/VISTARIL) tablet 50 mg (50 mg Oral Given 08/11/17 1908)     Initial Impression / Assessment and Plan / ED Course  I have reviewed the triage vital signs and the nursing notes.  Pertinent labs & imaging results that were available during my care of the patient were reviewed by me and considered in my medical decision making (see chart for details).     31 y.o. female here with 1 day of pleuritic chest pain which she thinks could be anxiety related, reports increased stress due to school, denies SOB. Also reports noticing orange spots in throat and wants that evaluated, denies pain there. On exam, mildly anxious appearing, in NAD, no hypoxia or tachycardia, obese but no overt pedal edema, no abdominal tenderness, no chest wall tenderness, lungs clear, throat with mild injection and a few small submucosal hemorrhage spots  but no other spots/lesions, mild nasal congestion; throat irritation likely from postnasal drainage, doubt need for further work up of this finding. Work up thus far: EKG unremarkable, CXR neg, CBC essentially WNL, BMP WNL, and troponin negative. Pt very anxious, discussed options for anxiolytics but pt doesn't want anything that could potentially be addictive; initially declined wanting anything that could cause drowsiness, but then later agreed to accepting something that may cause drowsiness. Will give vistaril and GI cocktail, and add-on D-dimer (low risk pt). Pt also wants U/A done, will get this and get Upreg as well. Will reassess shortly.   8:01 PM Upreg neg. U/A not yet in process, but pt not wanting to wait any longer and since this doesn't have anything to do with her work up for her symptoms today, I feel we can cancel this test. D-dimer negative. Pt feeling well and symptoms improved. Overall symptoms likely related to anxiety, could be some component of GERD as well; doubt cardiac etiology, doubt need for further emergent work up given neg trop >12hrs after onset, and improving symptoms. Will start on vistaril for anxiety, and this should also help with postnasal drainage/throat irritation. Discussed diet/lifestyle modifications for GERD, advised tylenol and avoidance/sparing use of NSAIDs only on full stomach, discussed other OTC remedies/zantac for symptomatic relief, and advised f/up with PCP in 5-7 days for recheck of symptoms and ongoing evaluation/management. I explained the diagnosis and have given explicit precautions to return to the ER including for any other new or worsening symptoms. The patient understands and accepts the medical plan as it's been dictated and I have answered their questions. Discharge instructions concerning home care and prescriptions have been given. The patient is STABLE and is discharged to home in good condition.    Final Clinical Impressions(s) / ED Diagnoses    Final diagnoses:  Precordial chest pain  Anxiety  Stress at home    New Prescriptions New Prescriptions   HYDROXYZINE (ATARAX/VISTARIL) 50 MG TABLET    Take 1 tablet (50 mg total) by mouth every 6 (six) hours as needed for anxiety.     [address] [place], [place] 08/11/17 Encarnacion Chu, MD 08/11/17 [phone]

## 2017-08-11 NOTE — ED Triage Notes (Signed)
Pt states centralized non radiating chest pain, denies any burning sensations. States a dull pain since yesterday worse with inspiration. Also states she has orange spots on her throat. Sates this may be anxiety.

## 2017-08-13 ENCOUNTER — Other Ambulatory Visit: Payer: Self-pay | Admitting: Obstetrics and Gynecology

## 2017-09-26 ENCOUNTER — Other Ambulatory Visit: Payer: Self-pay | Admitting: Obstetrics and Gynecology

## 2017-09-30 ENCOUNTER — Other Ambulatory Visit: Payer: Self-pay | Admitting: Obstetrics and Gynecology

## 2017-10-03 ENCOUNTER — Other Ambulatory Visit: Payer: Self-pay | Admitting: *Deleted

## 2017-10-03 ENCOUNTER — Telehealth: Payer: Self-pay | Admitting: Obstetrics and Gynecology

## 2017-10-03 MED ORDER — METRONIDAZOLE 500 MG PO TABS: 500.0000 mg | ORAL_TABLET | Freq: Two times a day (BID) | ORAL | 2 refills | Status: DC

## 2017-10-03 NOTE — Telephone Encounter (Signed)
Patient called and stated that she is not able to come in and needs her medication (asap), The patient is concerned as of why she is not able to get her medication refilled, and would like to speak with Amy. The patient also stated that the pharmacy sent over a request and it was denied.  No other information was disclosed. Please advise.

## 2017-10-03 NOTE — Telephone Encounter (Signed)
Done-ac 

## 2018-01-02 ENCOUNTER — Other Ambulatory Visit: Payer: Self-pay | Admitting: Obstetrics and Gynecology

## 2018-01-08 ENCOUNTER — Other Ambulatory Visit: Payer: Self-pay | Admitting: Obstetrics and Gynecology

## 2018-01-12 ENCOUNTER — Other Ambulatory Visit: Payer: Self-pay | Admitting: Obstetrics and Gynecology

## 2018-01-15 ENCOUNTER — Other Ambulatory Visit: Payer: Self-pay | Admitting: *Deleted

## 2018-01-15 ENCOUNTER — Telehealth: Payer: Self-pay | Admitting: *Deleted

## 2018-01-15 NOTE — Telephone Encounter (Signed)
Patient called and states she needs a refill of her Flagyl . Patient pharmacy is [facility] on [address] in [place]. Please advise. Thank you

## 2018-01-15 NOTE — Telephone Encounter (Signed)
Done-ac 

## 2018-01-16 ENCOUNTER — Other Ambulatory Visit: Payer: Self-pay | Admitting: Obstetrics and Gynecology

## 2018-01-31 ENCOUNTER — Other Ambulatory Visit: Payer: Self-pay | Admitting: Obstetrics and Gynecology

## 2018-04-09 ENCOUNTER — Other Ambulatory Visit: Payer: Self-pay | Admitting: Obstetrics and Gynecology

## 2018-04-23 ENCOUNTER — Other Ambulatory Visit: Payer: Self-pay | Admitting: Obstetrics and Gynecology

## 2018-04-24 ENCOUNTER — Other Ambulatory Visit: Payer: Self-pay | Admitting: Obstetrics and Gynecology

## 2018-05-06 ENCOUNTER — Ambulatory Visit (INDEPENDENT_AMBULATORY_CARE_PROVIDER_SITE_OTHER): Payer: BLUE CROSS/BLUE SHIELD | Admitting: Obstetrics and Gynecology

## 2018-05-06 ENCOUNTER — Encounter: Payer: Self-pay | Admitting: Obstetrics and Gynecology

## 2018-05-06 ENCOUNTER — Other Ambulatory Visit: Payer: Self-pay | Admitting: Obstetrics and Gynecology

## 2018-05-06 VITALS — BP 105/63 | HR 69 | Ht 70.0 in | Wt 388.0 lb

## 2018-05-06 DIAGNOSIS — Z01419 Encounter for gynecological examination (general) (routine) without abnormal findings: Secondary | ICD-10-CM

## 2018-05-06 DIAGNOSIS — E559 Vitamin D deficiency, unspecified: Secondary | ICD-10-CM | POA: Diagnosis not present

## 2018-05-06 MED ORDER — METRONIDAZOLE 0.75 % VA GEL: 1.0000 | Freq: Every day | VAGINAL | 2 refills | Status: DC

## 2018-05-06 NOTE — Progress Notes (Signed)
Subjective:     Christina Bowen is a single black  32 y.o. female and is here for a comprehensive physical exam. 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 Exercising 20-30 minutes a day 3 times a week. Is sexually active with female partner using condoms. Still getting BV after sex. The patient reports no problems.  Social History   Socioeconomic History  . Marital status: Single    Spouse name: Not on file  . Number of children: Not on file  . Years of education: Not on file  . Highest education level: Not on file  Occupational History  . Not on file  Social Needs  . Financial resource strain: Not on file  . Food insecurity:    Worry: Not on file    Inability: Not on file  . Transportation needs:    Medical: Not on file    Non-medical: Not on file  Tobacco Use  . Smoking status: Former Research scientist (life sciences)  . Smokeless tobacco: Never Used  Substance and Sexual Activity  . Alcohol use: Yes  . Drug use: No  . Sexual activity: Yes  Lifestyle  . Physical activity:    Days per week: Not on file    Minutes per session: Not on file  . Stress: Not on file  Relationships  . Social connections:    Talks on phone: Not on file    Gets together: Not on file    Attends religious service: Not on file    Active member of club or organization: Not on file    Attends meetings of clubs or organizations: Not on file    Relationship status: Not on file  . Intimate partner violence:    Fear of current or ex partner: Not on file    Emotionally abused: Not on file    Physically abused: Not on file    Forced sexual activity: Not on file  Other Topics Concern  . Not on file  Social History Narrative  . Not on file   Health Maintenance  Topic Date Due  . TETANUS/TDAP  08/15/2007  . INFLUENZA VACCINE  07/03/2018  . PAP SMEAR  04/30/2020  . HIV Screening  Completed    The following portions of the patient's history were reviewed and updated as appropriate: allergies, current  medications, past family history, past medical history, past social history, past surgical history and problem list.  Review of Systems Pertinent items noted in HPI and remainder of comprehensive ROS otherwise negative.   Objective:    General appearance: alert, cooperative, appears stated age and morbidly obese Neck: no adenopathy, no carotid bruit, no JVD, supple, symmetrical, trachea midline and thyroid not enlarged, symmetric, no tenderness/mass/nodules Lungs: clear to auscultation bilaterally Breasts: normal appearance, no masses or tenderness Heart: regular rate and rhythm, S1, S2 normal, no murmur, click, rub or gallop Abdomen: soft, non-tender; bowel sounds normal; no masses,  no organomegaly Pelvic: cervix normal in appearance, external genitalia normal, no adnexal masses or tenderness, no cervical motion tenderness, rectovaginal septum normal, uterus normal size, shape, and consistency, vagina normal without discharge and menstrual bleeding noted    Assessment:    Healthy female exam.  Vit D deficiency Morbid obesity Recurrent BV      Plan:  Labs obtained - will follow up accordingly. Switched to [employer] prn use. Also discussed boric acid capsules- will consider.   RTC 1 year or as needed,   Melody Shambley,CNM   See After Visit Summary  for Counseling Recommendations

## 2018-05-07 LAB — COMPREHENSIVE METABOLIC PANEL
ALT: 21 IU/L (ref 0–32)
AST: 16 IU/L (ref 0–40)
Albumin/Globulin Ratio: 1.5 (ref 1.2–2.2)
Albumin: 4 g/dL (ref 3.5–5.5)
Alkaline Phosphatase: 39 IU/L (ref 39–117)
BUN/Creatinine Ratio: 16 (ref 9–23)
BUN: 12 mg/dL (ref 6–20)
CHLORIDE: 106 mmol/L (ref 96–106)
CO2: 20 mmol/L (ref 20–29)
Calcium: 9 mg/dL (ref 8.7–10.2)
Creatinine, Ser: 0.75 mg/dL (ref 0.57–1.00)
GFR calc non Af Amer: 108 mL/min/{1.73_m2} (ref >59)
GFR, EST AFRICAN AMERICAN: 125 mL/min/{1.73_m2} (ref >59)
GLUCOSE: 88 mg/dL (ref 65–99)
Globulin, Total: 2.6 g/dL (ref 1.5–4.5)
POTASSIUM: 4.5 mmol/L (ref 3.5–5.2)
Sodium: 139 mmol/L (ref 134–144)
TOTAL PROTEIN: 6.6 g/dL (ref 6.0–8.5)

## 2018-05-07 LAB — LIPID PANEL
CHOL/HDL RATIO: 3.7 ratio (ref 0.0–4.4)
Cholesterol, Total: 174 mg/dL (ref 100–199)
HDL: 47 mg/dL (ref >39)
LDL CALC: 95 mg/dL (ref 0–99)
Triglycerides: 162 mg/dL — ABNORMAL HIGH (ref 0–149)
VLDL Cholesterol Cal: 32 mg/dL (ref 5–40)

## 2018-05-07 LAB — THYROID PANEL WITH TSH
Free Thyroxine Index: 1.7 (ref 1.2–4.9)
T3 Uptake Ratio: 25 % (ref 24–39)
T4 TOTAL: 6.7 ug/dL (ref 4.5–12.0)
TSH: 0.807 u[IU]/mL (ref 0.450–4.500)

## 2018-05-07 LAB — VITAMIN D 25 HYDROXY (VIT D DEFICIENCY, FRACTURES): VIT D 25 HYDROXY: 22.3 ng/mL — AB (ref 30.0–100.0)

## 2018-05-07 LAB — CYTOLOGY - PAP

## 2018-05-07 LAB — RPR: RPR Ser Ql: NONREACTIVE

## 2018-05-07 LAB — HSV 1 AND 2 IGM ABS, INDIRECT: HSV 1 IgM: <1:10 {titer}

## 2018-05-07 LAB — HEMOGLOBIN A1C
ESTIMATED AVERAGE GLUCOSE: 100 mg/dL
Hgb A1c MFr Bld: 5.1 % (ref 4.8–5.6)

## 2018-05-07 LAB — HIV ANTIBODY (ROUTINE TESTING W REFLEX): HIV SCREEN 4TH GENERATION: NONREACTIVE

## 2018-06-20 ENCOUNTER — Telehealth: Payer: Self-pay | Admitting: Obstetrics and Gynecology

## 2018-06-20 NOTE — Telephone Encounter (Signed)
Patient called requesting a refill on Vitamin [facility] [address]. Thanks

## 2018-06-23 ENCOUNTER — Other Ambulatory Visit: Payer: Self-pay | Admitting: *Deleted

## 2018-06-23 MED ORDER — CYANOCOBALAMIN 1000 MCG/ML IJ SOLN: 1000.0000 ug | INTRAMUSCULAR | 3 refills | Status: DC

## 2018-06-23 NOTE — Telephone Encounter (Signed)
Done-ac 

## 2018-08-01 ENCOUNTER — Telehealth: Payer: Self-pay | Admitting: Obstetrics and Gynecology

## 2018-08-01 NOTE — Telephone Encounter (Signed)
Patient called stating she needs a refill on Vitamin and B12 sent [facility] [address] in [place].Thanks

## 2018-08-05 ENCOUNTER — Other Ambulatory Visit: Payer: Self-pay | Admitting: *Deleted

## 2018-08-05 MED ORDER — VITAMIN D (ERGOCALCIFEROL) 1.25 MG (50000 UNIT) PO CAPS: 50000.0000 [IU] | ORAL_CAPSULE | ORAL | 3 refills | Status: DC

## 2018-08-05 NOTE — Telephone Encounter (Signed)
Done-ac 

## 2018-09-03 ENCOUNTER — Encounter: Payer: Self-pay | Admitting: *Deleted

## 2018-09-03 ENCOUNTER — Telehealth: Payer: Self-pay | Admitting: Obstetrics and Gynecology

## 2018-09-03 ENCOUNTER — Other Ambulatory Visit: Payer: Self-pay | Admitting: Obstetrics and Gynecology

## 2018-09-03 MED ORDER — DROSPIREN-ETH ESTRAD-LEVOMEFOL 3-0.02-0.451 MG PO TABS: 1.0000 | ORAL_TABLET | Freq: Every day | ORAL | 11 refills | Status: DC

## 2018-09-03 NOTE — Telephone Encounter (Signed)
Sent pt mychart message

## 2018-09-03 NOTE — Telephone Encounter (Signed)
I printed a prescription as she can restart with 4th day of upcoming menses ( if she is not having menses right now just have her start them when she picks it up). She has 4 different pharmacies listed so please ask her which one is to be her primary and clear the rest out.

## 2018-09-03 NOTE — Telephone Encounter (Signed)
Patient called stating she would like to restart birth control pills. Can a script be sent in or does she need an appointment.Thanks

## 2018-09-03 NOTE — Telephone Encounter (Signed)
pls advise

## 2019-02-02 ENCOUNTER — Telehealth: Payer: Self-pay | Admitting: Obstetrics and Gynecology

## 2019-02-02 NOTE — Telephone Encounter (Signed)
The patient called and stated that she needs a refill of her B12 medication sent to her new pharmacy [facility] on [address] in [place]. Please advise.

## 2019-02-06 ENCOUNTER — Other Ambulatory Visit: Payer: Self-pay | Admitting: *Deleted

## 2019-02-06 ENCOUNTER — Encounter: Payer: Self-pay | Admitting: *Deleted

## 2019-02-06 MED ORDER — CYANOCOBALAMIN 1000 MCG/ML IJ SOLN: 1000.0000 ug | INTRAMUSCULAR | 3 refills | Status: DC

## 2019-02-06 NOTE — Telephone Encounter (Signed)
Sent pt mychart message

## 2019-03-26 ENCOUNTER — Telehealth: Payer: Self-pay | Admitting: Obstetrics and Gynecology

## 2019-03-26 ENCOUNTER — Other Ambulatory Visit: Payer: Self-pay | Admitting: Obstetrics and Gynecology

## 2019-03-26 ENCOUNTER — Encounter: Payer: Self-pay | Admitting: *Deleted

## 2019-03-26 MED ORDER — METRONIDAZOLE 500 MG PO TABS
ORAL_TABLET | ORAL | 3 refills | Status: DC
Start: 2019-03-26 — End: 2019-05-27

## 2019-03-26 NOTE — Telephone Encounter (Signed)
Done-ac 

## 2019-03-26 NOTE — Telephone Encounter (Signed)
The patient called and stated that she needs a refill of her medication metroNIDAZOLE (FLAGYL) 500 MG tablet [5016], and that she sent a request for it sometime ago and did not receive a response. Please advise.

## 2019-05-12 ENCOUNTER — Encounter: Payer: BLUE CROSS/BLUE SHIELD | Admitting: Obstetrics and Gynecology

## 2019-05-26 ENCOUNTER — Telehealth: Payer: Self-pay

## 2019-05-26 NOTE — Telephone Encounter (Signed)
Coronavirus (COVID-19) Are you at risk?  Are you at risk for the Coronavirus (COVID-19)?  To be considered HIGH RISK for Coronavirus (COVID-19), you have to meet the following criteria:  . Traveled to [place], [place], [place], [place] or [place]; or in the [place] to [place], [place], [place], or [place]; and have fever, cough, and shortness of breath within the last 2 weeks of travel OR . Been in close contact with a person diagnosed with COVID-19 within the last 2 weeks and have fever, cough, and shortness of breath . IF YOU DO NOT MEET THESE CRITERIA, YOU ARE CONSIDERED LOW RISK FOR COVID-19.  What to do if you are HIGH RISK for COVID-19?  . If you are having a medical emergency, call 911. . Seek medical care right away. Before you go to a doctor's office, urgent care or emergency department, call ahead and tell them about your recent travel, contact with someone diagnosed with COVID-19, and your symptoms. You should receive instructions from your physician's office regarding next steps of care.  . When you arrive at healthcare provider, tell the healthcare staff immediately you have returned from visiting [place], [place], [place], [place] or [place]; or traveled in the [place] to [place], [place], [place], or [place]; in the last two weeks or you have been in close contact with a person diagnosed with COVID-19 in the last 2 weeks.   . Tell the health care staff about your symptoms: fever, cough and shortness of breath. . After you have been seen by a medical provider, you will be either: o Tested for (COVID-19) and discharged home on quarantine except to seek medical care if symptoms worsen, and asked to  - Stay home and avoid contact with others until you get your results (4-5 days)  - Avoid travel on public transportation if possible (such as bus, train, or airplane) or o Sent to the Emergency Department by EMS for evaluation, COVID-19 testing, and possible  admission depending on your condition and test results.  What to do if you are LOW RISK for COVID-19?  Reduce your risk of any infection by using the same precautions used for avoiding the common cold or flu:  . Wash your hands often with soap and warm water for at least 20 seconds.  If soap and water are not readily available, use an alcohol-based hand sanitizer with at least 60% alcohol.  . If coughing or sneezing, cover your mouth and nose by coughing or sneezing into the elbow areas of your shirt or coat, into a tissue or into your sleeve (not your hands). . Avoid shaking hands with others and consider head nods or verbal greetings only. . Avoid touching your eyes, nose, or mouth with unwashed hands.  . Avoid close contact with people who are sick. . Avoid places or events with large numbers of people in one location, like concerts or sporting events. . Carefully consider travel plans you have or are making. . If you are planning any travel outside or inside the [place], visit the CDC's Travelers' Health webpage for the latest health notices. . If you have some symptoms but not all symptoms, continue to monitor at home and seek medical attention if your symptoms worsen. . If you are having a medical emergency, call 911.   ADDITIONAL HEALTHCARE OPTIONS FOR PATIENTS  [facility] / e-Visit: [email or web address]         [facility]: [phone]  [facility]: [phone]                   [facility]: [phone]   Pre-screen negative, DM.   

## 2019-05-27 ENCOUNTER — Encounter: Payer: Self-pay | Admitting: Obstetrics and Gynecology

## 2019-05-27 ENCOUNTER — Other Ambulatory Visit: Payer: Self-pay

## 2019-05-27 ENCOUNTER — Ambulatory Visit (INDEPENDENT_AMBULATORY_CARE_PROVIDER_SITE_OTHER): Payer: BC Managed Care – PPO | Admitting: Obstetrics and Gynecology

## 2019-05-27 ENCOUNTER — Other Ambulatory Visit (HOSPITAL_COMMUNITY)
Admission: RE | Admit: 2019-05-27 | Discharge: 2019-05-27 | Disposition: A | Discharge status: Home / Self Care | Payer: BC Managed Care – PPO | Source: Ambulatory Visit | Attending: Obstetrics and Gynecology | Admitting: Obstetrics and Gynecology

## 2019-05-27 VITALS — BP 132/84 | HR 98 | Ht 70.0 in | Wt 399.6 lb

## 2019-05-27 DIAGNOSIS — E559 Vitamin D deficiency, unspecified: Secondary | ICD-10-CM

## 2019-05-27 DIAGNOSIS — Z01419 Encounter for gynecological examination (general) (routine) without abnormal findings: Secondary | ICD-10-CM | POA: Diagnosis not present

## 2019-05-27 DIAGNOSIS — Z23 Encounter for immunization: Secondary | ICD-10-CM

## 2019-05-27 MED ORDER — VITAMIN D (ERGOCALCIFEROL) 1.25 MG (50000 UNIT) PO CAPS: 50000.0000 [IU] | ORAL_CAPSULE | ORAL | 3 refills | Status: DC

## 2019-05-27 MED ORDER — TETANUS-DIPHTH-ACELL PERTUSSIS 5-2.5-18.5 LF-MCG/0.5 IM SUSP
0.5000 mL | Freq: Once | INTRAMUSCULAR | Status: AC
  Administered 2019-05-27: 0.5 mL via INTRAMUSCULAR

## 2019-05-27 MED ORDER — TRIAMCINOLONE ACETONIDE 0.1 % EX CREA: TOPICAL_CREAM | Freq: Two times a day (BID) | CUTANEOUS | 6 refills | Status: DC

## 2019-05-27 MED ORDER — METRONIDAZOLE 500 MG PO TABS: ORAL_TABLET | ORAL | 3 refills | Status: DC

## 2019-05-27 MED ORDER — METRONIDAZOLE 0.75 % VA GEL: 1.0000 | Freq: Every day | VAGINAL | 2 refills | Status: DC

## 2019-05-27 MED ORDER — PHENTERMINE HCL 37.5 MG PO TABS: 37.5000 mg | ORAL_TABLET | ORAL | 2 refills | Status: DC | PRN

## 2019-05-27 NOTE — Progress Notes (Signed)
Subjective:   Christina Bowen is a 33 y.o. Christina Bowen female here for a routine well-woman exam.  Patient's last menstrual period was 05/15/2019.    Current complaints: desires restart adipex as has been working on weight loss again, needs refill on metrogel. 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 Tobacco/alcohol: no tobacco use Illicit drugs: no history of illicit drug use  The following portions of the patient's history were reviewed and updated as appropriate: allergies, current medications, past family history, past medical history, past social history, past surgical history and problem list.  Past Medical History Past Medical History:  Diagnosis Date  . HPV (human papilloma virus) anogenital infection   . LGSIL (low grade squamous intraepithelial dysplasia)   . Vitamin D deficiency disease     Past Surgical History History reviewed. No pertinent surgical history.  Gynecologic History [id number]  Patient's last menstrual period was 05/15/2019. Contraception: none Last Pap: 05/2018. Results were: normal   Obstetric History OB History  Gravida Para Term Preterm AB Living  0 0 0 0 0 0  SAB TAB Ectopic Multiple Live Births  0 0 0 0      Current Medications Current Outpatient Medications on File Prior to Visit  Medication Sig Dispense Refill  . cyanocobalamin (,VITAMIN B-12,) 1000 MCG/ML injection Inject 1 mL (1,000 mcg total) into the muscle every 30 (thirty) days. 1 mL 3  . triamcinolone cream (KENALOG) 0.1 % APPLY TOPICALLY TWICE DAILY 45 g 0  . Vitamin D, Ergocalciferol, (DRISDOL) 50000 units CAPS capsule Take 1 capsule (50,000 Units total) by mouth 2 (two) times a week. 28 capsule 3  . ibuprofen (ADVIL,MOTRIN) 800 MG tablet Take 1 tablet (800 mg total) by mouth every 8 (eight)  hours as needed. (Patient not taking: Reported on 07/02/2016) 20 tablet 0  . metroNIDAZOLE (FLAGYL) 500 MG tablet TAKE 1 TABLET(500 MG) BY MOUTH TWICE DAILY (Patient not taking: Reported on 05/27/2019) 14 tablet 3  . metroNIDAZOLE (METROGEL) 0.75 % vaginal gel Place 1 Applicatorful vaginally at bedtime. Apply one applicatorful to vagina at bedtime for 5 days (Patient not taking: Reported on 05/27/2019) 70 g 2  . phentermine (ADIPEX-P) 37.5 MG tablet Take 37.5 mg by mouth as needed.  2   No current facility-administered medications on file prior to visit.     Review of Systems Patient denies any headaches, blurred vision, shortness of breath, chest pain, abdominal pain, problems with bowel movements, urination, or intercourse.  Objective:  Ht 5\' 10"  (1.778 m)   Wt (!) 399 lb 9.6 oz (181.3 kg)   LMP 05/15/2019   BMI 57.34 kg/m  Physical Exam  General:  Well developed, well nourished, no acute distress. She is alert and oriented x3. Skin:  Warm and dry Neck:  Midline trachea, no thyromegaly or nodules Cardiovascular: Regular rate and rhythm, no murmur heard Lungs:  Effort normal, all lung fields clear to auscultation bilaterally Breasts:  No dominant palpable mass, retraction, or nipple discharge Abdomen:  Soft, non tender, no hepatosplenomegaly or masses Pelvic:  External genitalia is normal in appearance.  The vagina is normal in appearance. The cervix is bulbous, no CMT.  Thin prep pap is done with HR HPV cotesting. Uterus is felt to be normal size, shape, and contour.  No adnexal masses or tenderness noted. Extremities:  No swelling  or varicosities noted Psych:  She has a normal mood and affect  Assessment:   Healthy well-woman exam BMI 57 Vit d deficiency H/o recurrent BV Needs Tdap  Plan:  Labs obtained will follow up accordingly meds refilled Tdap given. Referral placed to establish care with PCP at [facility] in [place]. F/U 1 year for AE, or sooner if needed    Rockney Ghee, CNM

## 2019-05-27 NOTE — Patient Instructions (Signed)
 Preventive Care 18-39 Years, Female Preventive care refers to lifestyle choices and visits with your health care provider that can promote health and wellness. What does preventive care include?   A yearly physical exam. This is also called an annual well check.  Dental exams once or twice a year.  Routine eye exams. Ask your health care provider how often you should have your eyes checked.  Personal lifestyle choices, including: ? Daily care of your teeth and gums. ? Regular physical activity. ? Eating a healthy diet. ? Avoiding tobacco and drug use. ? Limiting alcohol use. ? Practicing safe sex. ? Taking vitamin and mineral supplements as recommended by your health care provider. What happens during an annual well check? The services and screenings done by your health care provider during your annual well check will depend on your age, overall health, lifestyle risk factors, and family history of disease. Counseling Your health care provider may ask you questions about your:  Alcohol use.  Tobacco use.  Drug use.  Emotional well-being.  Home and relationship well-being.  Sexual activity.  Eating habits.  Work and work environment.  Method of birth control.  Menstrual cycle.  Pregnancy history. Screening You may have the following tests or measurements:  Height, weight, and BMI.  Diabetes screening. This is done by checking your blood sugar (glucose) after you have not eaten for a while (fasting).  Blood pressure.  Lipid and cholesterol levels. These may be checked every 5 years starting at age 20.  Skin check.  Hepatitis C blood test.  Hepatitis B blood test.  Sexually transmitted disease (STD) testing.  BRCA-related cancer screening. This may be done if you have a family history of breast, ovarian, tubal, or peritoneal cancers.  Pelvic exam and Pap test. This may be done every 3 years starting at age 21. Starting at age 30, this may be done  every 5 years if you have a Pap test in combination with an HPV test. Discuss your test results, treatment options, and if necessary, the need for more tests with your health care provider. Vaccines Your health care provider may recommend certain vaccines, such as:  Influenza vaccine. This is recommended every year.  Tetanus, diphtheria, and acellular pertussis (Tdap, Td) vaccine. You may need a Td booster every 10 years.  Varicella vaccine. You may need this if you have not been vaccinated.  HPV vaccine. If you are 26 or younger, you may need three doses over 6 months.  Measles, mumps, and rubella (MMR) vaccine. You may need at least one dose of MMR. You may also need a second dose.  Pneumococcal 13-valent conjugate (PCV13) vaccine. You may need this if you have certain conditions and were not previously vaccinated.  Pneumococcal polysaccharide (PPSV23) vaccine. You may need one or two doses if you smoke cigarettes or if you have certain conditions.  Meningococcal vaccine. One dose is recommended if you are age 19-21 years and a first-year college student living in a residence hall, or if you have one of several medical conditions. You may also need additional booster doses.  Hepatitis A vaccine. You may need this if you have certain conditions or if you travel or work in places where you may be exposed to hepatitis A.  Hepatitis B vaccine. You may need this if you have certain conditions or if you travel or work in places where you may be exposed to hepatitis B.  Haemophilus influenzae type b (Hib) vaccine. You may need this if   you have certain risk factors. Talk to your health care provider about which screenings and vaccines you need and how often you need them. This information is not intended to replace advice given to you by your health care provider. Make sure you discuss any questions you have with your health care provider. Document Released: 01/15/2002 Document Revised:  07/02/2017 Document Reviewed: 09/20/2015 Elsevier Interactive Patient Education  2019 Elsevier Inc.  

## 2019-05-28 LAB — TSH: TSH: 1.38 u[IU]/mL (ref 0.450–4.500)

## 2019-05-28 LAB — COMPREHENSIVE METABOLIC PANEL
ALT: 21 IU/L (ref 0–32)
AST: 17 IU/L (ref 0–40)
Albumin/Globulin Ratio: 1.5 (ref 1.2–2.2)
Albumin: 4.2 g/dL (ref 3.9–5.0)
Alkaline Phosphatase: 43 IU/L (ref 39–117)
BUN/Creatinine Ratio: 14 (ref 9–23)
BUN: 10 mg/dL (ref 6–20)
Bilirubin Total: 0.3 mg/dL (ref 0.0–1.2)
CO2: 21 mmol/L (ref 20–29)
Calcium: 8.9 mg/dL (ref 8.7–10.2)
Chloride: 104 mmol/L (ref 96–106)
Creatinine, Ser: 0.71 mg/dL (ref 0.57–1.00)
GFR calc Af Amer: 132 mL/min/{1.73_m2} (ref >59)
GFR calc non Af Amer: 115 mL/min/{1.73_m2} (ref >59)
Globulin, Total: 2.8 g/dL (ref 1.5–4.5)
Glucose: 97 mg/dL (ref 65–99)
Potassium: 4.5 mmol/L (ref 3.5–5.2)
Sodium: 140 mmol/L (ref 134–144)
Total Protein: 7 g/dL (ref 6.0–8.5)

## 2019-05-28 LAB — HIV ANTIBODY (ROUTINE TESTING W REFLEX): HIV Screen 4th Generation wRfx: NONREACTIVE

## 2019-05-28 LAB — LIPID PANEL
Chol/HDL Ratio: 3.5 ratio (ref 0.0–4.4)
Cholesterol, Total: 173 mg/dL (ref 100–199)
HDL: 50 mg/dL (ref >39)
LDL Calculated: 103 mg/dL — ABNORMAL HIGH (ref 0–99)
Triglycerides: 101 mg/dL (ref 0–149)
VLDL Cholesterol Cal: 20 mg/dL (ref 5–40)

## 2019-05-28 LAB — VITAMIN D 25 HYDROXY (VIT D DEFICIENCY, FRACTURES): Vit D, 25-Hydroxy: 31.5 ng/mL (ref 30.0–100.0)

## 2019-05-28 LAB — HEMOGLOBIN A1C
Est. average glucose Bld gHb Est-mCnc: 103 mg/dL
Hgb A1c MFr Bld: 5.2 % (ref 4.8–5.6)

## 2019-05-28 LAB — RPR: RPR Ser Ql: NONREACTIVE

## 2019-06-01 LAB — CYTOLOGY - PAP
Diagnosis: NEGATIVE
HPV 16/18/45 genotyping: NEGATIVE
HPV: DETECTED — AB

## 2019-06-02 ENCOUNTER — Encounter: Payer: Self-pay | Admitting: General Practice

## 2019-07-27 ENCOUNTER — Other Ambulatory Visit: Payer: Self-pay | Admitting: Obstetrics and Gynecology

## 2019-07-29 ENCOUNTER — Telehealth: Payer: Self-pay | Admitting: Obstetrics and Gynecology

## 2019-07-29 ENCOUNTER — Other Ambulatory Visit: Payer: Self-pay | Admitting: *Deleted

## 2019-07-29 MED ORDER — VITAMIN D (ERGOCALCIFEROL) 1.25 MG (50000 UNIT) PO CAPS: 50000.0000 [IU] | ORAL_CAPSULE | ORAL | 3 refills | Status: DC

## 2019-07-29 NOTE — Telephone Encounter (Signed)
Done-ac 

## 2019-07-29 NOTE — Telephone Encounter (Signed)
The patient called and stated that she needs a refill of her vitamin D prescription. Please advise.

## 2019-08-19 ENCOUNTER — Encounter: Payer: Self-pay | Admitting: Family Medicine

## 2019-08-19 ENCOUNTER — Other Ambulatory Visit: Payer: Self-pay

## 2019-08-19 ENCOUNTER — Ambulatory Visit: Payer: Commercial Managed Care - PPO | Admitting: Family Medicine

## 2019-08-19 VITALS — BP 120/70 | HR 90 | Temp 96.7°F | Ht 70.0 in | Wt 399.0 lb

## 2019-08-19 DIAGNOSIS — E559 Vitamin D deficiency, unspecified: Secondary | ICD-10-CM | POA: Diagnosis not present

## 2019-08-19 DIAGNOSIS — Z7689 Persons encountering health services in other specified circumstances: Secondary | ICD-10-CM

## 2019-08-19 DIAGNOSIS — Z6841 Body Mass Index (BMI) 40.0 and over, adult: Secondary | ICD-10-CM | POA: Insufficient documentation

## 2019-08-19 DIAGNOSIS — E538 Deficiency of other specified B group vitamins: Secondary | ICD-10-CM | POA: Diagnosis not present

## 2019-08-19 DIAGNOSIS — J302 Other seasonal allergic rhinitis: Secondary | ICD-10-CM

## 2019-08-19 DIAGNOSIS — H9311 Tinnitus, right ear: Secondary | ICD-10-CM | POA: Insufficient documentation

## 2019-08-19 NOTE — Progress Notes (Signed)
Patient presents to clinic today to establish care.  SUBJECTIVE: PMH: Pt is a 33yo female with pmh sig for seasonal allergies and obesity.  Pt has not had a pcp in yrs.  Seen by OB/Gyn, Dr. Lorelle Gibbs.  Seasonal allergies: -allegra not helping -states symptoms are bad -zyrtec has not worked in the past. -tried nasal spray but did not like it  Tinnitus: -endorses occasional ringing in R ear x a while -does not last long  Obesity: -on Phentermine prn -has not been able to exercise 2/2 COVID 19 pandemic -trying to eat better -trying not to eat late, but may have dinner at 7pm then go to bed at 9 pm  Vitamin D def.: -when initially checked was 9 -pt states was put on Ergocalciferol 50,000 IU three x per wk. -Vit D was 31 in June 2020 -now on Ergocalciferol 50,000 IU twice a wk  H/o B 12 def: -receiving B12 injections monthly -unsure what her B12 level was.  Skin irritation: -pt endorses rash and acne on face (cheeks) -states noticed prior to having to wear face mask for work 2/2 COVID 19 pandemic. -at times rash seems more like hives. -will clear up with allergy medicine  Allergies: NKDA  Social hx: Pt is engaged.  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  Pt is a former smoker.  She smoked 1 pk q 3 days for a few yrs in her early 30s.  Pt denies current tobacco use or drug use.  Pt endorses social EtOH use.   Health Maintenance: Dental --Dr. Gloriann Loan [facility] Glennon Mac [facility] Immunizations --tetanus 2020 PAP -- 05/27/2019 LMP--08/15/19    Past Medical History:  Diagnosis Date  . HPV (human papilloma virus) anogenital infection   . LGSIL (low grade squamous intraepithelial dysplasia)   . Vitamin D deficiency disease     History reviewed. No pertinent surgical history.  Current Outpatient Medications on File Prior to Visit  Medication Sig Dispense Refill  . cyanocobalamin (,VITAMIN B-12,) 1000 MCG/ML injection INJECT 1ML INTO THE MUSCLE  EVERY 30 DAYS 3 mL 1  . metroNIDAZOLE (FLAGYL) 500 MG tablet TAKE 1 TABLET(500 MG) BY MOUTH TWICE DAILY 14 tablet 3  . metroNIDAZOLE (METROGEL) 0.75 % vaginal gel Place 1 Applicatorful vaginally at bedtime. Apply one applicatorful to vagina at bedtime for 5 days 70 g 2  . phentermine (ADIPEX-P) 37.5 MG tablet Take 1 tablet (37.5 mg total) by mouth as needed. 30 tablet 2  . triamcinolone cream (KENALOG) 0.1 % Apply topically 2 (two) times daily. 45 g 6  . Vitamin D, Ergocalciferol, (DRISDOL) 1.25 MG (50000 UT) CAPS capsule Take 1 capsule (50,000 Units total) by mouth 2 (two) times a week. 28 capsule 3  . ibuprofen (ADVIL,MOTRIN) 800 MG tablet Take 1 tablet (800 mg total) by mouth every 8 (eight) hours as needed. (Patient not taking: Reported on 07/02/2016) 20 tablet 0   No current facility-administered medications on file prior to visit.     No Known Allergies  Family History  Problem Relation Age of Onset  . Diabetes Father   . Heart disease Maternal Grandmother     Social History   Socioeconomic History  . Marital status: Single    Spouse name: Not on file  . Number of children: Not on file  . Years of education: Not on file  . Highest education level: Not on file  Occupational History  . Not on file  Social Needs  . Financial  resource strain: Not on file  . Food insecurity    Worry: Not on file    Inability: Not on file  . Transportation needs    Medical: Not on file    Non-medical: Not on file  Tobacco Use  . Smoking status: Former Research scientist (life sciences)  . Smokeless tobacco: Never Used  Substance and Sexual Activity  . Alcohol use: Yes  . Drug use: No  . Sexual activity: Yes  Lifestyle  . Physical activity    Days per week: Not on file    Minutes per session: Not on file  . Stress: Not on file  Relationships  . Social Herbalist on phone: Not on file    Gets together: Not on file    Attends religious service: Not on file    Active member of club or organization: Not  on file    Attends meetings of clubs or organizations: Not on file    Relationship status: Not on file  . Intimate partner violence    Fear of current or ex partner: Not on file    Emotionally abused: Not on file    Physically abused: Not on file    Forced sexual activity: Not on file  Other Topics Concern  . Not on file  Social History Narrative  . Not on file    ROS General: Denies fever, chills, night sweats, changes in appetite   +weight gain HEENT: Denies headaches, ear pain, changes in vision, rhinorrhea, sore throat CV: Denies CP, palpitations, SOB, orthopnea Pulm: Denies SOB, cough, wheezing GI: Denies abdominal pain, nausea, vomiting, diarrhea, constipation GU: Denies dysuria, hematuria, frequency, vaginal discharge Msk: Denies muscle cramps, joint pains Neuro: Denies weakness, numbness, tingling Skin: Denies rashes, bruising   Psych: Denies depression, anxiety, hallucinations  BP 120/70 (BP Location: Left Wrist, Patient Position: Sitting, Cuff Size: Large)   Pulse 90   Temp (!) 96.7 F (35.9 C) (Temporal)   Ht 5\' 10"  (1.778 m)   Wt (!) 399 lb (181 kg)   LMP 08/15/2019 (Exact Date)   SpO2 98%   BMI 57.25 kg/m   Physical Exam Gen. Pleasant, well developed, well-nourished, in NAD HEENT - [place]/AT, PERRL, EOMI, conjunctive clear, no scleral icterus, no nasal drainage, pharynx without erythema or exudate. Lungs: no use of accessory muscles, CTAB, no wheezes, rales or rhonchi Cardiovascular: RRR, No r/g/m, no peripheral edema Abdomen: BS present, soft, nontender,nondistended Musculoskeletal: No deformities, moves all four extremities, no cyanosis or clubbing, normal tone Neuro:  A&Ox3, CN II-XII intact, normal gait Skin:  Warm, dry, intact, no lesions   Recent Results (from the past 2160 hour(s))  Cytology - PAP     Status: Abnormal   Collection Time: 05/27/19 12:00 AM  Result Value Ref Range   Adequacy      Satisfactory for evaluation   endocervical/transformation zone component PRESENT.   Diagnosis      NEGATIVE FOR INTRAEPITHELIAL LESIONS OR MALIGNANCY.   HPV 16/18/45 genotyping NEGATIVE for HPV 16 & 18/45     Comment: Normal Reference Range - Negative   HPV DETECTED (A)     Comment: Normal Reference Range - NOT Detected   Material Submitted CervicoVaginal Pap [ThinPrep Imaged]    CYTOLOGY - PAP PAP RESULT   Vitamin D (25 hydroxy)     Status: None   Collection Time: 05/27/19  8:43 AM  Result Value Ref Range   Vit D, 25-Hydroxy 31.5 30.0 - 100.0 ng/mL    Comment: Vitamin D  deficiency has been defined by the [place] practice guideline as a level of serum 25-OH vitamin D less than 20 ng/mL (1,2). The Endocrine Society went on to further define vitamin D insufficiency as a level between 21 and 29 ng/mL (2). 1. IOM (Institute of Medicine). 2010. Dietary reference    intakes for calcium and D. [place]: The    [employer]. 2. Holick MF, Binkley [place], Bischoff-Ferrari HA, et al.    Evaluation, treatment, and prevention of vitamin D    deficiency: an Endocrine Society clinical practice    guideline. JCEM. 2011 Jul; 96(7):1911-30.   TSH     Status: None   Collection Time: 05/27/19  8:43 AM  Result Value Ref Range   TSH 1.380 0.450 - 4.500 uIU/mL  Lipid panel     Status: Abnormal   Collection Time: 05/27/19  8:43 AM  Result Value Ref Range   Cholesterol, Total 173 100 - 199 mg/dL   Triglycerides 101 0 - 149 mg/dL   HDL 50 >39 mg/dL   VLDL Cholesterol Cal 20 5 - 40 mg/dL   LDL Calculated 103 (H) 0 - 99 mg/dL   Chol/HDL Ratio 3.5 0.0 - 4.4 ratio    Comment:                                   T. Chol/HDL Ratio                                             Men  Women                               1/2 Avg.Risk  3.4    3.3                                   Avg.Risk  5.0    4.4                                2X Avg.Risk  9.6    7.1                                3X  Avg.Risk 23.4   11.0   Comprehensive metabolic panel     Status: None   Collection Time: 05/27/19  8:43 AM  Result Value Ref Range   Glucose 97 65 - 99 mg/dL   BUN 10 6 - 20 mg/dL   Creatinine, Ser 0.71 0.57 - 1.00 mg/dL   GFR calc non Af Amer 115 >59 mL/min/1.73   GFR calc Af Amer 132 >59 mL/min/1.73   BUN/Creatinine Ratio 14 9 - 23   Sodium 140 134 - 144 mmol/L   Potassium 4.5 3.5 - 5.2 mmol/L   Chloride 104 96 - 106 mmol/L   CO2 21 20 - 29 mmol/L   Calcium 8.9 8.7 - 10.2 mg/dL   Total Protein 7.0 6.0 - 8.5 g/dL   Albumin 4.2 3.9 - 5.0 g/dL   Globulin, Total 2.8 1.5 - 4.5 g/dL  Albumin/Globulin Ratio 1.5 1.2 - 2.2   Bilirubin Total 0.3 0.0 - 1.2 mg/dL   Alkaline Phosphatase 43 39 - 117 IU/L   AST 17 0 - 40 IU/L   ALT 21 0 - 32 IU/L  HIV Antibody (routine testing w rflx)     Status: None   Collection Time: 05/27/19  8:43 AM  Result Value Ref Range   HIV Screen 4th Generation wRfx Non Reactive Non Reactive  RPR     Status: None   Collection Time: 05/27/19  8:43 AM  Result Value Ref Range   RPR Ser Ql Non Reactive Non Reactive  Hemoglobin A1c     Status: None   Collection Time: 05/27/19  8:43 AM  Result Value Ref Range   Hgb A1c MFr Bld 5.2 4.8 - 5.6 %    Comment:          Prediabetes: 5.7 - 6.4          Diabetes: >6.4          Glycemic control for adults with diabetes: <7.0    Est. average glucose Bld gHb Est-mCnc 103 mg/dL    Assessment/Plan: Seasonal allergies -discussed trying a new allergy med such as claritin or xyzal. -also advised to use saline nasal rinse or flonase  -given handout  Vitamin D deficiency -continue current supplements -will recheck in a few months  Vitamin B12 deficiency -continue current supplements -recheck in a few months  BMI 50.0-59.9, adult ([place]) -discussed lifestyle modifications -consider weight management clinic -given handouts  Encounter to establish care -We reviewed the PMH, PSH, FH, SH, Meds and Allergies. -We  provided refills for any medications we will prescribe as needed. -We addressed current concerns per orders and patient instructions. -We have asked for records for pertinent exams, studies, vaccines and notes from previous providers. -We have advised patient to follow up per instructions below.  Tinnitus of right ear -given handout  F/u prn  Grier Mitts, MD

## 2019-08-19 NOTE — Patient Instructions (Signed)
Tinnitus Tinnitus refers to hearing a sound when there is no actual source for that sound. This is often described as ringing in the ears. However, people with this condition may hear a variety of noises, in one ear or in both ears. The sounds of tinnitus can be soft, loud, or somewhere in between. Tinnitus can last for a few seconds or can be constant for days. It may go away without treatment and come back at various times. When tinnitus is constant or happens often, it can lead to other problems, such as trouble sleeping and trouble concentrating. Almost everyone experiences tinnitus at some point. Tinnitus that is long-lasting (chronic) or comes back often (recurs) may require medical attention. What are the causes? The cause of tinnitus is often not known. In some cases, it can result from other problems or conditions, including:  Exposure to loud noises from machinery, music, or other sources.  Hearing loss.  Ear or sinus infections.  Earwax buildup.  An object (foreign body) stuck in the ear.  Taking certain medicines.  Drinking alcohol or caffeine.  High blood pressure.  Heart diseases.  Anemia.  Allergies.  Meniere's disease.  Thyroid problems.  Tumors.  A weak, bulging blood vessel (aneurysm) near the ear.  Depression or other mood disorders. What are the signs or symptoms? The main symptom of tinnitus is hearing a sound when there is no source for that sound. It may sound like:  Buzzing.  Roaring.  Ringing.  Blowing air, like the sound heard when you listen to a seashell.  Hissing.  Whistling.  Sizzling.  Humming.  Running water.  A musical note.  Tapping. Symptoms may affect only one ear (unilateral) or both ears (bilateral). How is this diagnosed? Tinnitus is diagnosed based on your symptoms, your medical history, and a physical exam. Your health care provider may do a thorough hearing test (audiologic exam) if your tinnitus:  Is  unilateral.  Causes hearing difficulties.  Lasts 6 months or longer. You may work with a health care provider who specializes in hearing disorders (audiologist). You may be asked questions about your symptoms and how they affect your daily life. You may have other tests done, such as:  CT scan.  MRI.  An imaging test of how blood flows through your blood vessels (angiogram). How is this treated? Treating an underlying medical condition can sometimes make tinnitus go away. If your tinnitus continues, other treatments may include:  Medicines, such as antidepressants or sleeping aids.  Sound generators to mask the tinnitus. These include: ? Tabletop sound machines that play relaxing sounds to help you fall asleep. ? Wearable devices that fit in your ear and play sounds or music. ? Acoustic neural stimulation. This involves using headphones to listen to music that contains an auditory signal. Over time, listening to this signal may change some pathways in your brain and make you less sensitive to tinnitus. This treatment is used for very severe cases when no other treatment is working.  Therapy and counseling to help you manage the stress of living with tinnitus.  Using hearing aids or cochlear implants if your tinnitus is related to hearing loss. Hearing aids are worn in the outer ear. Cochlear implants are surgically placed in the inner ear. Follow these instructions at home: Managing symptoms      When possible, avoid being in loud places and being exposed to loud sounds.  Wear hearing protection, such as earplugs, when you are exposed to loud noises.  Use   a white noise machine, a humidifier, or other devices to mask the sound of tinnitus.  Practice techniques for reducing stress, such as meditation, yoga, or deep breathing. Work with your health care provider if you need help with managing stress.  Sleep with your head slightly raised. This may reduce the impact of tinnitus.  General instructions  Do not use stimulants, such as nicotine, alcohol, or caffeine. Talk with your health care provider about other stimulants to avoid. Stimulants are substances that can make you feel alert and attentive by increasing certain activities in the body (such as heart rate and blood pressure). These substances may make tinnitus worse.  Take over-the-counter and prescription medicines only as told by your health care provider.  Try to get plenty of sleep each night.  Keep all follow-up visits as told by your health care provider. This is important. Contact a health care provider if:  Your tinnitus continues for 3 weeks or longer without stopping.  Your symptoms get worse or do not get better with home care.  You develop tinnitus after a head injury.  You have tinnitus along with any of the following: ? Dizziness. ? Loss of balance. ? Nausea and vomiting. Summary  Tinnitus refers to hearing a sound when there is no actual source for that sound. This is often described as ringing in the ears.  Symptoms may affect only one ear (unilateral) or both ears (bilateral).  Use a white noise machine, a humidifier, or other devices to mask the sound of tinnitus.  Do not use stimulants, such as nicotine, alcohol, or caffeine. Talk with your health care provider about other stimulants to avoid. These substances may make tinnitus worse. This information is not intended to replace advice given to you by your health care provider. Make sure you discuss any questions you have with your health care provider. Document Released: 11/19/2005 Document Revised: 11/01/2017 Document Reviewed: 08/29/2017 Elsevier Patient Education  [employer].  Allergies, Adult An allergy is when your body's defense system (immune system) overreacts to an otherwise harmless substance (allergen) that you breathe in or eat or something that touches your skin. When you come into contact with something that  you are allergic to, your immune system produces certain proteins (antibodies). These proteins cause cells to release chemicals (histamines) that trigger the symptoms of an allergic reaction. Allergies often affect the nasal passages (allergic rhinitis), eyes (allergic conjunctivitis), skin (atopic dermatitis), and stomach. Allergies can be mild or severe. Allergies cannot spread from person to person (are not contagious). They can develop at any age and may be outgrown. What increases the risk? You may be at greater risk of allergies if other people in your family have allergies. What are the signs or symptoms? Symptoms depend on what type of allergy you have. They may include:  Runny, stuffy nose.  Sneezing.  Itchy mouth, ears, or throat.  Postnasal drip.  Sore throat.  Itchy, red, watery, or puffy eyes.  Skin rash or hives.  Stomach pain.  Vomiting.  Diarrhea.  Bloating.  Wheezing or coughing. People with a severe allergy to food, medicine, or an insect bite may have a life-threatening allergic reaction (anaphylaxis). Symptoms of anaphylaxis include:  Hives.  Itching.  Flushed face.  Swollen lips, tongue, or mouth.  Tight or swollen throat.  Chest pain or tightness in the chest.  Trouble breathing or shortness of breath.  Rapid heartbeat.  Dizziness or fainting.  Vomiting.  Diarrhea.  Pain in the abdomen. How is  this diagnosed? This condition is diagnosed based on:  Your symptoms.  Your family and medical history.  A physical exam. You may need to see a health care provider who specializes in treating allergies (allergist). You may also have tests, including:  Skin tests to see which allergens are causing your symptoms, such as: ? Skin prick test. In this test, your skin is pricked with a tiny needle and exposed to small amounts of possible allergens to see if your skin reacts. ? Intradermal skin test. In this test, a small amount of allergen is  injected under your skin to see if your skin reacts. ? Patch test. In this test, a small amount of allergen is placed on your skin and then your skin is covered with a bandage. Your health care provider will check your skin after a couple of days to see if a rash has developed.  Blood tests.  Challenges tests. In this test, you inhale a small amount of allergen by mouth to see if you have an allergic reaction. You may also be asked to:  Keep a food diary. A food diary is a record of all the foods and drinks you have in a day and any symptoms you experience.  Practice an elimination diet. An elimination diet involves eliminating specific foods from your diet and then adding them back in one by one to find out if a certain food causes an allergic reaction. How is this treated? Treatment for allergies depends on your symptoms. Treatment may include:  Cold compresses to soothe itching and swelling.  Eye drops.  Nasal sprays.  Using a saline spray or container (neti pot) to flush out the nose (nasal irrigation). These methods can help clear away mucus and keep the nasal passages moist.  Using a humidifier.  Oral antihistamines or other medicines to block allergic reaction and inflammation.  Skin creams to treat rashes or itching.  Diet changes to eliminate food allergy triggers.  Repeated exposure to tiny amounts of allergens to build up a tolerance and prevent future allergic reactions (immunotherapy). These include: ? Allergy shots. ? Oral treatment. This involves taking small doses of an allergen under the tongue (sublingual immunotherapy).  Emergency epinephrine injection (auto-injector) in case of an allergic emergency. This is a self-injectable, pre-measured medicine that must be given within the first few minutes of a serious allergic reaction. Follow these instructions at home:         Avoid known allergens whenever possible.  If you suffer from airborne allergens, wash  out your nose daily. You can do this with a saline spray or a neti pot to flush out your nose (nasal irrigation).  Take over-the-counter and prescription medicines only as told by your health care provider.  Keep all follow-up visits as told by your health care provider. This is important.  If you are at risk of a severe allergic reaction (anaphylaxis), keep your auto-injector with you at all times.  If you have ever had anaphylaxis, wear a medical alert bracelet or necklace that states you have a severe allergy. Contact a health care provider if:  Your symptoms do not improve with treatment. Get help right away if:  You have symptoms of anaphylaxis, such as: ? Swollen mouth, tongue, or throat. ? Pain or tightness in your chest. ? Trouble breathing or shortness of breath. ? Dizziness or fainting. ? Severe abdominal pain, vomiting, or diarrhea. This information is not intended to replace advice given to you by your health care  provider. Make sure you discuss any questions you have with your health care provider. Document Released: 02/12/2003 Document Revised: 02/12/2018 Document Reviewed: 06/06/2016 Elsevier Patient Education  2020 [place].  Vitamin B12 Deficiency Vitamin B12 deficiency occurs when the body does not have enough vitamin B12, which is an important vitamin. The body needs this vitamin:  To make red blood cells.  To make DNA. This is the genetic material inside cells.  To help the nerves work properly so they can carry messages from the brain to the body. Vitamin B12 deficiency can cause various health problems, such as a low red blood cell count (anemia) or nerve damage. What are the causes? This condition may be caused by:  Not eating enough foods that contain vitamin B12.  Not having enough stomach acid and digestive fluids to properly absorb vitamin B12 from the food that you eat.  Certain digestive system diseases that make it hard to absorb vitamin B12.  These diseases include Crohn's disease, chronic pancreatitis, and cystic fibrosis.  A condition in which the body does not make enough of a protein (intrinsic factor), resulting in too few red blood cells (pernicious anemia).  Having a surgery in which part of the stomach or small intestine is removed.  Taking certain medicines that make it hard for the body to absorb vitamin B12. These medicines include: ? Heartburn medicines (antacids and proton pump inhibitors). ? Certain antibiotic medicines. ? Some medicines that are used to treat diabetes, tuberculosis, gout, or high cholesterol. What increases the risk? The following factors may make you more likely to develop a B12 deficiency:  Being older than age 25.  Eating a vegetarian or vegan diet, especially while you are pregnant.  Eating a poor diet while you are pregnant.  Taking certain medicines.  Having alcoholism. What are the signs or symptoms? In some cases, there are no symptoms of this condition. If the condition leads to anemia or nerve damage, various symptoms can occur, such as:  Weakness.  Fatigue.  Loss of appetite.  Weight loss.  Numbness or tingling in your hands and feet.  Redness and burning of the tongue.  Confusion or memory problems.  Depression.  Sensory problems, such as color blindness, ringing in the ears, or loss of taste.  Diarrhea or constipation.  Trouble walking. If anemia is severe, symptoms can include:  Shortness of breath.  Dizziness.  Rapid heart rate (tachycardia). How is this diagnosed? This condition may be diagnosed with a blood test to measure the level of vitamin B12 in your blood. You may also have other tests, including:  A group of tests that measure certain characteristics of blood cells (complete blood count, CBC).  A blood test to measure intrinsic factor.  A procedure where a thin tube with a camera on the end is used to look into your stomach or intestines  (endoscopy). Other tests may be needed to discover the cause of B12 deficiency. How is this treated? Treatment for this condition depends on the cause. This condition may be treated by:  Changing your eating and drinking habits, such as: ? Eating more foods that contain vitamin B12. ? Drinking less alcohol or no alcohol.  Getting vitamin B12 injections.  Taking vitamin B12 supplements. Your health care provider will tell you which dosage is best for you. Follow these instructions at home: Eating and drinking   Eat lots of healthy foods that contain vitamin B12, including: ? Meats and poultry. This includes beef, pork, chicken, [place],  and organ meats, such as liver. ? Seafood. This includes clams, rainbow trout, salmon, tuna, and haddock. ? Eggs. ? Cereal and dairy products that are fortified. This means that vitamin B12 has been added to the food. Check the label on the package to see if the food is fortified. The items listed above may not be a complete list of recommended foods and beverages. Contact a dietitian for more information. General instructions  Get any injections that are prescribed by your health care provider.  Take supplements only as told by your health care provider. Follow the directions carefully.  Do not drink alcohol if your health care provider tells you not to. In some cases, you may only be asked to limit alcohol use.  Keep all follow-up visits as told by your health care provider. This is important. Contact a health care provider if:  Your symptoms come back. Get help right away if you:  Develop shortness of breath.  Have a rapid heart rate.  Have chest pain.  Become dizzy or lose consciousness. Summary  Vitamin B12 deficiency occurs when the body does not have enough vitamin B12.  The main causes of vitamin B12 deficiency include dietary deficiency, digestive diseases, pernicious anemia, and having a surgery in which part of the stomach or  small intestine is removed.  In some cases, there are no symptoms of this condition. If the condition leads to anemia or nerve damage, various symptoms can occur, such as weakness, shortness of breath, and numbness.  Treatment may include getting vitamin B12 injections or taking vitamin B12 supplements. Eat lots of healthy foods that contain vitamin B12. This information is not intended to replace advice given to you by your health care provider. Make sure you discuss any questions you have with your health care provider. Document Released: 02/11/2012 Document Revised: 07/29/2018 Document Reviewed: 07/29/2018 Elsevier Patient Education  2020 [place].  Vitamin D Deficiency Vitamin D deficiency is when your body does not have enough vitamin D. Vitamin D is important to your body for many reasons:  It helps the body absorb two important minerals-calcium and phosphorus.  It plays a role in bone health.  It may help to prevent some diseases, such as diabetes and multiple sclerosis.  It plays a role in muscle function, including heart function. If vitamin D deficiency is severe, it can cause a condition in which your bones become soft. In adults, this condition is called osteomalacia. In children, this condition is called rickets. What are the causes? This condition may be caused by:  Not eating enough foods that contain vitamin D.  Not getting enough natural sun exposure.  Having certain digestive system diseases that make it difficult for your body to absorb vitamin D. These diseases include Crohn's disease, chronic pancreatitis, and cystic fibrosis.  Having a surgery in which a part of the stomach or a part of the small intestine is removed.  Having chronic kidney disease or liver disease. What increases the risk? You are more likely to develop this condition if you:  Are older.  Do not spend much time outdoors.  Live in a long-term care facility.  Have had broken bones.   Have weak or thin bones (osteoporosis).  Have a disease or condition that changes how the body absorbs vitamin D.  Have dark skin.  Take certain medicines, such as steroid medicines or certain seizure medicines.  Are overweight or obese. What are the signs or symptoms? In mild cases of vitamin D deficiency,  there may not be any symptoms. If the condition is severe, symptoms may include:  Bone pain.  Muscle pain.  Falling often.  Broken bones caused by a minor injury. How is this diagnosed? This condition may be diagnosed with blood tests. Imaging tests such as X-rays may also be done to look for changes in the bone. How is this treated? Treatment for this condition may depend on what caused the condition. Treatment options include:  Taking vitamin D supplements. Your health care provider will suggest what dose is best for you.  Taking a calcium supplement. Your health care provider will suggest what dose is best for you. Follow these instructions at home: Eating and drinking   Eat foods that contain vitamin D. Choices include: ? Fortified dairy products, cereals, or juices. Fortified means that vitamin D has been added to the food. Check the label on the package to see if the food is fortified. ? Fatty fish, such as salmon or trout. ? Eggs. ? Oysters. ? Mushrooms. The items listed above may not be a complete list of recommended foods and beverages. Contact a dietitian for more information. General instructions  Take medicines and supplements only as told by your health care provider.  Get regular, safe exposure to natural sunlight.  Do not use a tanning bed.  Maintain a healthy weight. Lose weight if needed.  Keep all follow-up visits as told by your health care provider. This is important. How is this prevented? You can get vitamin D by:  Eating foods that naturally contain vitamin D.  Eating or drinking products that have been fortified with vitamin D, such  as cereals, juices, and dairy products (including milk).  Taking a vitamin D supplement or a multivitamin supplement that contains vitamin D.  Being in the sun. Your body naturally makes vitamin D when your skin is exposed to sunlight. Your body changes the sunlight into a form of the vitamin that it can use. Contact a health care provider if:  Your symptoms do not go away.  You feel nauseous or you vomit.  You have fewer bowel movements than usual or are constipated. Summary  Vitamin D deficiency is when your body does not have enough vitamin D.  Vitamin D is important to your body for good bone health and muscle function, and it may help prevent some diseases.  Vitamin D deficiency is primarily treated through supplementation. Your health care provider will suggest what dose is best for you.  You can get vitamin D by eating foods that contain vitamin D, by being in the sun, and by taking a vitamin D supplement or a multivitamin supplement that contains vitamin D. This information is not intended to replace advice given to you by your health care provider. Make sure you discuss any questions you have with your health care provider. Document Released: 02/11/2012 Document Revised: 07/28/2018 Document Reviewed: 07/28/2018 Elsevier Patient Education  2020 [employer].

## 2019-08-26 ENCOUNTER — Other Ambulatory Visit: Payer: Self-pay | Admitting: Obstetrics and Gynecology

## 2019-10-21 ENCOUNTER — Telehealth: Payer: Commercial Managed Care - PPO | Admitting: Family Medicine

## 2019-10-21 ENCOUNTER — Telehealth (INDEPENDENT_AMBULATORY_CARE_PROVIDER_SITE_OTHER): Payer: Commercial Managed Care - PPO | Admitting: Family Medicine

## 2019-10-21 ENCOUNTER — Other Ambulatory Visit: Payer: Self-pay

## 2019-10-21 DIAGNOSIS — S39012A Strain of muscle, fascia and tendon of lower back, initial encounter: Secondary | ICD-10-CM | POA: Diagnosis not present

## 2019-10-21 MED ORDER — CYCLOBENZAPRINE HCL 10 MG PO TABS: 10.0000 mg | ORAL_TABLET | Freq: Three times a day (TID) | ORAL | 0 refills | Status: DC | PRN

## 2019-10-21 NOTE — Progress Notes (Signed)
This visit type was conducted due to national recommendations for restrictions regarding the COVID-19 pandemic in an effort to limit this patient's exposure and mitigate transmission in our community.   Virtual Visit via Telephone Note  I connected with Christina Bowen on 10/21/19 at  5:15 PM EST by telephone and verified that I am speaking with the correct person using two identifiers.   I discussed the limitations, risks, security and privacy concerns of performing an evaluation and management service by telephone and the availability of in person appointments. I also discussed with the patient that there may be a patient responsible charge related to this service. The patient expressed understanding and agreed to proceed.  Location patient: home Location provider: work or home office Participants present for the call: patient, provider Patient did not have a visit in the prior 7 days to address this/these issue(s).   History of Present Illness: Ravae called to discuss recent probable left back strain.  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  She first noticed some pain left lumbar area on Saturday.  Her symptoms were much worse by yesterday.  She had difficulty getting through the workday today.  Her pain radiates both superior and inferior to the left lower rib cage area.  Pain is worse with movement.  She denies any fever, chills, skin rash, dysuria.  She tried some icy hot without much improvement.  She took 2 Aleve this morning with minimal improvement.  She is not tried any heat or ice.  Denies any prior history of back injury.  Denies any radiculitis symptoms.  Past Medical History:  Diagnosis Date  . HPV (human papilloma virus) anogenital infection   . LGSIL (low grade squamous intraepithelial dysplasia)   . Vitamin D deficiency disease    No past surgical history on file.  reports that she has quit smoking. She has never used smokeless tobacco. She reports current  alcohol use. She reports that she does not use drugs. family history includes Diabetes in her father; Heart disease in her maternal grandmother. No Known Allergies    Observations/Objective: Patient sounds cheerful and well on the phone. I do not appreciate any SOB. Speech and thought processing are grossly intact. Patient reported vitals:  Assessment and Plan:  Left lower lumbar back pain.  Suspect muscular strain/injury.  She does not describe any neurologic deficits or radiculitis symptoms  -Recommend try heat and or ice for symptom relief -Consider trial of Flexeril 10 mg nightly.  She is aware this may cause some sedation. -Can continue over-the-counter anti-inflammatory as needed -Light duty note given for the 19th and 20th for no lifting over 20 pounds and avoid frequent bending at the waist -Consider physical therapy if not improving over the next couple of weeks  Follow Up Instructions:  -As above   [id number] [id number] [id number] [id number] [id number] 21-30 I did not refer this patient for an OV in the next 24 hours for this/these issue(s).  I discussed the assessment and treatment plan with the patient. The patient was provided an opportunity to ask questions and all were answered. The patient agreed with the plan and demonstrated an understanding of the instructions.   The patient was advised to call back or seek an in-person evaluation if the symptoms worsen or if the condition fails to improve as anticipated.  I provided 13 minutes of non-face-to-face time during this encounter.   Carolann Littler, MD

## 2019-10-22 ENCOUNTER — Telehealth: Payer: Self-pay

## 2019-10-22 ENCOUNTER — Other Ambulatory Visit: Payer: Self-pay

## 2019-10-22 NOTE — Telephone Encounter (Signed)
Spoke with Christina Bowen concerning medication refill request for Vit D 50000 units 1 tablet twice a week. Christina Bowen had blood work done 05/27/19 Vit D levels were 31.5. Christina Bowen stated that MS gave her standing orders for Vit D due to her (Christina Bowen) concerns of levels being low. Christina Bowen is requesting for medication to be refills as prescribed by MS which is Vit D 50000 units 1 tablet twice daily. Please advise.

## 2019-10-22 NOTE — Telephone Encounter (Signed)
Spoke with pt concerning medication refill request for Vitamin D2 50,000 taking 1 capsule twice a week. Pt had labs done 05/27/19 Vit D levels were 31.5. Pt stated that MS gave her standing orders for the Vit D due to a request and concern from her(pt) because she is scared her Vit D will drop. Pt is requesting refill of Vit D as prescribed by MS which is 2 tablets a week 50000 units. Please advise.

## 2019-10-26 ENCOUNTER — Other Ambulatory Visit: Payer: Self-pay

## 2019-10-26 MED ORDER — VITAMIN D (ERGOCALCIFEROL) 1.25 MG (50000 UNIT) PO CAPS: 50000.0000 [IU] | ORAL_CAPSULE | ORAL | 1 refills | Status: DC

## 2020-01-27 ENCOUNTER — Other Ambulatory Visit: Payer: Self-pay

## 2020-01-27 MED ORDER — CYANOCOBALAMIN 1000 MCG/ML IJ SOLN: INTRAMUSCULAR | 1 refills | Status: DC

## 2020-02-03 ENCOUNTER — Ambulatory Visit: Payer: Commercial Managed Care - PPO | Admitting: Certified Nurse Midwife

## 2020-02-03 ENCOUNTER — Encounter: Payer: Self-pay | Admitting: Certified Nurse Midwife

## 2020-02-03 ENCOUNTER — Other Ambulatory Visit: Payer: Self-pay

## 2020-02-03 VITALS — BP 126/85 | HR 83 | Ht 70.0 in | Wt >= 6400 oz

## 2020-02-03 DIAGNOSIS — Z79899 Other long term (current) drug therapy: Secondary | ICD-10-CM

## 2020-02-03 MED ORDER — PHENTERMINE HCL 37.5 MG PO TABS: 37.5000 mg | ORAL_TABLET | ORAL | 0 refills | Status: DC | PRN

## 2020-02-03 NOTE — Progress Notes (Signed)
  Medication Management Clinic Visit Note  Patient: Christina Bowen MRN: [id number] Date of Birth: [birth year] PCP: Billie Ruddy, MD   Lodie [facility] 34 y.o. female presents for medication management and to establish care. She was a patient of Melody Shambley CNM and will be seeing me in June for her annual exam. She wanted to talk about her medications. She was being treated with weight loss plan and using vitamin B injections and adipex. She also got triamcinolone cream for eczema on various areas of her body, she was receiving flagyl and Metrogel to treat recurrent BV infections.   BP 126/85   Pulse 83   Ht 5\' 10"  (1.778 m)   Wt (!) 400 lb 1 oz (181.5 kg)   LMP 01/16/2020 (Approximate)   BMI 57.40 kg/m   Patient Information   Past Medical History:  Diagnosis Date  . HPV (human papilloma virus) anogenital infection   . LGSIL (low grade squamous intraepithelial dysplasia)   . Vitamin D deficiency disease      History reviewed. No pertinent surgical history.   Family History  Problem Relation Age of Onset  . Diabetes Father   . Heart disease Maternal Grandmother     New Diagnoses (since last visit): none  Family Support: Good    Social History   Substance and Sexual Activity  Alcohol Use Yes      Social History   Tobacco Use  Smoking Status Former Smoker  Smokeless Tobacco Never Used      Health Maintenance  Topic Date Due  . INFLUENZA VACCINE  03/02/2020 (Originally 07/04/2019)  . PAP SMEAR-Modifier  05/26/2022  . TETANUS/TDAP  05/26/2029  . HIV Screening  Completed     Assessment and Plan: Discussed weight loss plan, information given on medication use, Discussed diet and exercise. Pt state that she does not use the phentamine daily and that she has lost weight. Will refill phentermine today. Offered referral for nutritionist. 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 She denies any negative side effects including SOB, chest pain . Will refill  all other medications when she returns for her annual exam.   I attest more than 50% of visit spent reviewing history, discussing current medications, and weightloss program and recommendations. Face to face time 15 min.   Philip Aspen, CNM

## 2020-02-03 NOTE — Patient Instructions (Addendum)

## 2020-03-25 ENCOUNTER — Other Ambulatory Visit: Payer: Self-pay | Admitting: Certified Nurse Midwife

## 2020-03-28 NOTE — Telephone Encounter (Signed)
Ivin Booty,   Please let this pt know that I will not refill this script, She will need to see me for a visit.   Nira Conn

## 2020-04-27 ENCOUNTER — Ambulatory Visit: Payer: Commercial Managed Care - PPO | Admitting: Certified Nurse Midwife

## 2020-04-27 ENCOUNTER — Other Ambulatory Visit: Payer: Self-pay

## 2020-04-27 ENCOUNTER — Encounter: Payer: Self-pay | Admitting: Certified Nurse Midwife

## 2020-04-27 VITALS — BP 120/82 | HR 83 | Ht 70.0 in | Wt 391.5 lb

## 2020-04-27 DIAGNOSIS — Z7689 Persons encountering health services in other specified circumstances: Secondary | ICD-10-CM

## 2020-04-27 MED ORDER — PHENTERMINE HCL 37.5 MG PO TABS: 37.5000 mg | ORAL_TABLET | ORAL | 0 refills | Status: DC | PRN

## 2020-04-27 NOTE — Progress Notes (Signed)
SUBJECTIVE:  34 y.o. here for follow-up weight loss visit, previously seen 8 weeks ago. Denies any concerns and feels like medication is working well. She denies any negative side effectsShe states she has difficulty getting to the office monthly due to were she lives and her work schedule. Suggested doing tele visit every other month if needed. She verbalizes and agrees to plan.   OBJECTIVE:  BP 133/90   Pulse 83   Ht 5\' 10"  (1.778 m)   Wt (!) 391 lb 8 oz (177.6 kg)   LMP 04/09/2020 (Approximate)   BMI 56.17 kg/m   Body mass index is 56.17 kg/m.  Total weight loss 9lbs Repeat bp: 120/82  She is working out 2 times a week at gym 30 min -1 hr Modified her diet smaller portions Patient appears well. ASSESSMENT:  Obesity- responding well to weight loss plan PLAN:  To continue with current medications. B12 1053mcg/ml injection given RTC in 4 weeks as planned  Philip Aspen, CNM

## 2020-04-27 NOTE — Patient Instructions (Signed)

## 2020-05-05 ENCOUNTER — Encounter: Payer: Self-pay | Admitting: Family Medicine

## 2020-05-30 ENCOUNTER — Encounter: Payer: Self-pay | Admitting: Certified Nurse Midwife

## 2020-05-30 ENCOUNTER — Other Ambulatory Visit (HOSPITAL_COMMUNITY)
Admission: RE | Admit: 2020-05-30 | Discharge: 2020-05-30 | Disposition: A | Discharge status: Home / Self Care | Payer: Commercial Managed Care - PPO | Source: Ambulatory Visit | Attending: Certified Nurse Midwife | Admitting: Certified Nurse Midwife

## 2020-05-30 ENCOUNTER — Other Ambulatory Visit: Payer: Self-pay

## 2020-05-30 ENCOUNTER — Ambulatory Visit (INDEPENDENT_AMBULATORY_CARE_PROVIDER_SITE_OTHER): Payer: Commercial Managed Care - PPO | Admitting: Certified Nurse Midwife

## 2020-05-30 VITALS — BP 116/95 | HR 86 | Ht 70.0 in | Wt 392.0 lb

## 2020-05-30 DIAGNOSIS — Z01419 Encounter for gynecological examination (general) (routine) without abnormal findings: Secondary | ICD-10-CM

## 2020-05-30 DIAGNOSIS — Z113 Encounter for screening for infections with a predominantly sexual mode of transmission: Secondary | ICD-10-CM | POA: Diagnosis not present

## 2020-05-30 DIAGNOSIS — Z124 Encounter for screening for malignant neoplasm of cervix: Secondary | ICD-10-CM | POA: Diagnosis not present

## 2020-05-30 DIAGNOSIS — E669 Obesity, unspecified: Secondary | ICD-10-CM

## 2020-05-30 NOTE — Patient Instructions (Signed)
Preventive Care 21-32 Years Old, Female Preventive care refers to visits with your health care provider and lifestyle choices that can promote health and wellness. This includes:  A yearly physical exam. This may also be called an annual well check.  Regular dental visits and eye exams.  Immunizations.  Screening for certain conditions.  Healthy lifestyle choices, such as eating a healthy diet, getting regular exercise, not using drugs or products that contain nicotine and tobacco, and limiting alcohol use. What can I expect for my preventive care visit? Physical exam Your health care provider will check your:  Height and weight. This may be used to calculate body mass index (BMI), which tells if you are at a healthy weight.  Heart rate and blood pressure.  Skin for abnormal spots. Counseling Your health care provider may ask you questions about your:  Alcohol, tobacco, and drug use.  Emotional well-being.  Home and relationship well-being.  Sexual activity.  Eating habits.  Work and work environment.  Method of birth control.  Menstrual cycle.  Pregnancy history. What immunizations do I need?  Influenza (flu) vaccine  This is recommended every year. Tetanus, diphtheria, and pertussis (Tdap) vaccine  You may need a Td booster every 10 years. Varicella (chickenpox) vaccine  You may need this if you have not been vaccinated. Human papillomavirus (HPV) vaccine  If recommended by your health care provider, you may need three doses over 6 months. Measles, mumps, and rubella (MMR) vaccine  You may need at least one dose of MMR. You may also need a second dose. Meningococcal conjugate (MenACWY) vaccine  One dose is recommended if you are age 19-21 years and a first-year college student living in a residence hall, or if you have one of several medical conditions. You may also need additional booster doses. Pneumococcal conjugate (PCV13) vaccine  You may need  this if you have certain conditions and were not previously vaccinated. Pneumococcal polysaccharide (PPSV23) vaccine  You may need one or two doses if you smoke cigarettes or if you have certain conditions. Hepatitis A vaccine  You may need this if you have certain conditions or if you travel or work in places where you may be exposed to hepatitis A. Hepatitis B vaccine  You may need this if you have certain conditions or if you travel or work in places where you may be exposed to hepatitis B. Haemophilus influenzae type b (Hib) vaccine  You may need this if you have certain conditions. You may receive vaccines as individual doses or as more than one vaccine together in one shot (combination vaccines). Talk with your health care provider about the risks and benefits of combination vaccines. What tests do I need?  Blood tests  Lipid and cholesterol levels. These may be checked every 5 years starting at age 20.  Hepatitis C test.  Hepatitis B test. Screening  Diabetes screening. This is done by checking your blood sugar (glucose) after you have not eaten for a while (fasting).  Sexually transmitted disease (STD) testing.  BRCA-related cancer screening. This may be done if you have a family history of breast, ovarian, tubal, or peritoneal cancers.  Pelvic exam and Pap test. This may be done every 3 years starting at age 21. Starting at age 30, this may be done every 5 years if you have a Pap test in combination with an HPV test. Talk with your health care provider about your test results, treatment options, and if necessary, the need for more tests.   Follow these instructions at home: Eating and drinking   Eat a diet that includes fresh fruits and vegetables, whole grains, lean protein, and low-fat dairy.  Take vitamin and mineral supplements as recommended by your health care provider.  Do not drink alcohol if: ? Your health care provider tells you not to drink. ? You are  pregnant, may be pregnant, or are planning to become pregnant.  If you drink alcohol: ? Limit how much you have to 0-1 drink a day. ? Be aware of how much alcohol is in your drink. In the [place] one drink equals one 12 oz bottle of beer (355 mL), one 5 oz glass of wine (148 mL), or one 1 oz glass of hard liquor (44 mL). Lifestyle  Take daily care of your teeth and gums.  Stay active. Exercise for at least 30 minutes on 5 or more days each week.  Do not use any products that contain nicotine or tobacco, such as cigarettes, e-cigarettes, and chewing tobacco. If you need help quitting, ask your health care provider.  If you are sexually active, practice safe sex. Use a condom or other form of birth control (contraception) in order to prevent pregnancy and STIs (sexually transmitted infections). If you plan to become pregnant, see your health care provider for a preconception visit. What's next?  Visit your health care provider once a year for a well check visit.  Ask your health care provider how often you should have your eyes and teeth checked.  Stay up to date on all vaccines. This information is not intended to replace advice given to you by your health care provider. Make sure you discuss any questions you have with your health care provider. Document Revised: 07/31/2018 Document Reviewed: 07/31/2018 Elsevier Patient Education  2020 [employer].

## 2020-05-30 NOTE — Progress Notes (Signed)
GYNECOLOGY ANNUAL PREVENTATIVE CARE ENCOUNTER NOTE  History:     Christina Bowen is a 34 y.o. G0P0000 female here for a routine annual gynecologic exam.  Current complaints: none.   Denies abnormal vaginal bleeding, discharge, pelvic pain, problems with intercourse or other gynecologic concerns.     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 Denies drugs and smoking   Gynecologic History No LMP recorded. Contraception: none Last Pap: 05/27/19 Results were: normal with positive HPV Last mammogram: n/a  Obstetric History OB History  Gravida Para Term Preterm AB Living  0 0 0 0 0 0  SAB TAB Ectopic Multiple Live Births  0 0 0 0      Past Medical History:  Diagnosis Date  . HPV (human papilloma virus) anogenital infection   . LGSIL (low grade squamous intraepithelial dysplasia)   . Vitamin D deficiency disease     No past surgical history on file.  Current Outpatient Medications on File Prior to Visit  Medication Sig Dispense Refill  . cyanocobalamin (,VITAMIN B-12,) 1000 MCG/ML injection INJECT 1 ML INTO THE MUSCLE EVERY 30 DAYS. 3 mL 1  . metroNIDAZOLE (FLAGYL) 500 MG tablet TAKE 1 TABLET(500 MG) BY MOUTH TWICE DAILY 14 tablet 3  . metroNIDAZOLE (METROGEL) 0.75 % vaginal gel Place 1 Applicatorful vaginally at bedtime. Apply one applicatorful to vagina at bedtime for 5 days 70 g 2  . phentermine (ADIPEX-P) 37.5 MG tablet Take 1 tablet (37.5 mg total) by mouth as needed. 30 tablet 0  . triamcinolone cream (KENALOG) 0.1 % Apply topically 2 (two) times daily. 45 g 6  . Vitamin D, Ergocalciferol, (DRISDOL) 1.25 MG (50000 UT) CAPS capsule Take 1 capsule (50,000 Units total) by mouth every 7 (seven) days. 12 capsule 1  . cyclobenzaprine (FLEXERIL) 10 MG tablet Take 1 tablet (10 mg total) by mouth 3 (three) times daily as needed for muscle spasms. 30 tablet 0  .  ibuprofen (ADVIL,MOTRIN) 800 MG tablet Take 1 tablet (800 mg total) by mouth every 8 (eight) hours as needed. (Patient not taking: Reported on 07/02/2016) 20 tablet 0   No current facility-administered medications on file prior to visit.    No Known Allergies  Social History:  reports that she has quit smoking. She has never used smokeless tobacco. She reports current alcohol use. She reports that she does not use drugs.  Family History  Problem Relation Age of Onset  . Diabetes Father   . Heart disease Maternal Grandmother     The following portions of the patient's history were reviewed and updated as appropriate: allergies, current medications, past family history, past medical history, past social history, past surgical history and problem list.  Review of Systems Pertinent items noted in HPI and remainder of comprehensive ROS otherwise negative.  Physical Exam:  There were no vitals taken for this visit. CONSTITUTIONAL: Well-developed, well-nourished female in no acute distress.  HENT:  Normocephalic, atraumatic, External right and left ear normal. Oropharynx is clear and moist EYES: Conjunctivae and EOM are normal. Pupils are equal, round, and reactive to light. No scleral icterus.  NECK: Normal range of motion, supple, no masses.  Normal thyroid.  SKIN: Skin is warm and dry. No rash noted. Not diaphoretic. No erythema. No pallor. MUSCULOSKELETAL: Normal range of motion. No tenderness.  No cyanosis, clubbing, or edema.  2+ distal pulses. NEUROLOGIC: Alert and oriented to person,  place, and time. Normal reflexes, muscle tone coordination.  PSYCHIATRIC: Normal mood and affect. Normal behavior. Normal judgment and thought content. CARDIOVASCULAR: Normal heart rate noted, regular rhythm RESPIRATORY: Clear to auscultation bilaterally. Effort and breath sounds normal, no problems with respiration noted. BREASTS: Symmetric in size. No masses, tenderness, skin changes, nipple drainage,  or lymphadenopathy bilaterally.  ABDOMEN: Soft, no distention noted.  No tenderness, rebound or guarding.  PELVIC: Normal appearing external genitalia and urethral meatus; normal appearing vaginal mucosa and cervix.  No abnormal discharge noted.  Pap smear obtained. uterine size- difficult to assess due to body habitus, no other palpable masses, no uterine or adnexal tenderness.  .   Assessment and Plan:    1. Women's annual routine gynecological examination  Will follow up results of pap smear and manage accordingly. Mammogram not indicated Labs: request LIPID, Hem A1C , TSH, Vitamin D, STD testing Refill: none Referrals:none Routine preventative health maintenance measures emphasized. Please refer to After Visit Summary for other counseling recommendations.      Philip Aspen, CNM

## 2020-05-31 LAB — THYROID PANEL WITH TSH
Free Thyroxine Index: 1.9 (ref 1.2–4.9)
T3 Uptake Ratio: 27 % (ref 24–39)
T4, Total: 6.9 ug/dL (ref 4.5–12.0)
TSH: 0.952 u[IU]/mL (ref 0.450–4.500)

## 2020-05-31 LAB — LIPID PANEL
Chol/HDL Ratio: 3.6 ratio (ref 0.0–4.4)
Cholesterol, Total: 163 mg/dL (ref 100–199)
HDL: 45 mg/dL (ref >39)
LDL Chol Calc (NIH): 102 mg/dL — ABNORMAL HIGH (ref 0–99)
Triglycerides: 82 mg/dL (ref 0–149)
VLDL Cholesterol Cal: 16 mg/dL (ref 5–40)

## 2020-05-31 LAB — RPR: RPR Ser Ql: NONREACTIVE

## 2020-05-31 LAB — HEPATITIS B SURFACE ANTIGEN: Hepatitis B Surface Ag: NEGATIVE

## 2020-05-31 LAB — HIV ANTIBODY (ROUTINE TESTING W REFLEX): HIV Screen 4th Generation wRfx: NONREACTIVE

## 2020-05-31 LAB — HSV 1 AND 2 IGM ABS, INDIRECT
HSV 1 IgM: <1:10 {titer}
HSV 2 IgM: <1:10 {titer}

## 2020-05-31 LAB — HEMOGLOBIN A1C
Est. average glucose Bld gHb Est-mCnc: 103 mg/dL
Hgb A1c MFr Bld: 5.2 % (ref 4.8–5.6)

## 2020-05-31 LAB — VITAMIN D 25 HYDROXY (VIT D DEFICIENCY, FRACTURES): Vit D, 25-Hydroxy: 30.9 ng/mL (ref 30.0–100.0)

## 2020-06-01 ENCOUNTER — Encounter: Payer: Commercial Managed Care - PPO | Admitting: Certified Nurse Midwife

## 2020-06-03 LAB — CYTOLOGY - PAP
Comment: NEGATIVE
Comment: NEGATIVE
Diagnosis: NEGATIVE
HPV 16: NEGATIVE
HPV 18 / 45: NEGATIVE
High risk HPV: POSITIVE — AB

## 2020-07-05 ENCOUNTER — Other Ambulatory Visit: Payer: Self-pay | Admitting: Certified Nurse Midwife

## 2020-09-13 ENCOUNTER — Other Ambulatory Visit: Payer: Self-pay

## 2020-09-13 MED ORDER — METRONIDAZOLE 500 MG PO TABS: ORAL_TABLET | ORAL | 3 refills | Status: DC

## 2020-09-13 NOTE — Telephone Encounter (Signed)
mychart message sent to patient

## 2020-09-15 ENCOUNTER — Ambulatory Visit: Payer: BC Managed Care – PPO | Admitting: Family Medicine

## 2020-09-15 ENCOUNTER — Encounter: Payer: Self-pay | Admitting: Family Medicine

## 2020-09-15 ENCOUNTER — Other Ambulatory Visit: Payer: Self-pay

## 2020-09-15 VITALS — BP 118/92 | HR 88 | Temp 97.8°F | Ht 70.0 in | Wt 392.0 lb

## 2020-09-15 DIAGNOSIS — G4700 Insomnia, unspecified: Secondary | ICD-10-CM

## 2020-09-15 DIAGNOSIS — L709 Acne, unspecified: Secondary | ICD-10-CM

## 2020-09-15 DIAGNOSIS — E538 Deficiency of other specified B group vitamins: Secondary | ICD-10-CM | POA: Diagnosis not present

## 2020-09-15 DIAGNOSIS — F439 Reaction to severe stress, unspecified: Secondary | ICD-10-CM

## 2020-09-15 DIAGNOSIS — R5383 Other fatigue: Secondary | ICD-10-CM | POA: Diagnosis not present

## 2020-09-15 DIAGNOSIS — R350 Frequency of micturition: Secondary | ICD-10-CM | POA: Diagnosis not present

## 2020-09-15 DIAGNOSIS — E559 Vitamin D deficiency, unspecified: Secondary | ICD-10-CM | POA: Diagnosis not present

## 2020-09-15 LAB — POC URINALSYSI DIPSTICK (AUTOMATED)
Bilirubin, UA: NEGATIVE
Blood, UA: NEGATIVE
Glucose, UA: NEGATIVE
Ketones, UA: NEGATIVE
Leukocytes, UA: NEGATIVE
Nitrite, UA: NEGATIVE
Protein, UA: NEGATIVE
Spec Grav, UA: 1.02 (ref 1.010–1.025)
Urobilinogen, UA: 0.2 E.U./dL
pH, UA: 6 (ref 5.0–8.0)

## 2020-09-15 MED ORDER — CLINDAMYCIN PHOS-BENZOYL PEROX 1-5 % EX GEL: Freq: Every day | CUTANEOUS | 0 refills | Status: DC | PRN

## 2020-09-15 NOTE — Patient Instructions (Addendum)
A prescription for BenzaClin gel was sent to pharmacy.  You can use this on your face daily though it may be drying to your skin.  If this is the case you can use it every few days on your face.  You should also use a good moisturizer with SPF of at least 15-30 during the day.  Continue drinking plenty of water. If you do not see any improvement in her skin we can always send you to see the dermatologist.  Preventing Vitamin D Deficiency Vitamin D is a nutrient that helps your body absorb calcium from food. It plays a key role in the health of bones and teeth, muscle function, and infection prevention. Our bodies make vitamin D when our skin is exposed to direct sunlight. However, for many people, this may not be enough vitamin D to meet the body's needs. When you get too little vitamin D, it is called a deficiency. How can this condition affect me? A vitamin D deficiency can put you at risk of developing conditions that cause bones to be brittle, such as rickets or osteoporosis. If you are over age 32, not having enough vitamin D may weaken your muscles and bones and increase your risk for falls and broken bones. What can increase my risk? You may be at risk for a vitamin D deficiency if you:  Are pregnant.  Are obese.  Are over 94 years old.  Have dark skin.  Take certain medicines that affect the way vitamin D is absorbed.  Have had gastric bypass surgery. Other risk factors include:  Having a condition that limits your ability to absorb fat, such as cystic fibrosis, celiac disease, or inflammatory bowel disease.  Having certain inherited conditions.  Not having access to foods rich in vitamin D.  Having limited ability to move.  Living in areas that have fewer hours of sunlight.  Spending most of your day indoors, or you cover your skin all the time when you are outdoors. Breastfed infants are also at risk for vitamin D deficiency. What actions can I take to reduce my risk of  a vitamin D deficiency? Knowing the best sources of vitamin D You can meet your daily vitamin D needs from:  Foods.  Dietary supplements.  Direct exposure to natural sunlight.  Infant formula (for babies). Knowing how much vitamin D you need General recommendations for daily vitamin D intake vary by these categories:  Infants: 400 International Units.  Children over 32 year old: 600 International Units.  Adults: 600 International Units.  Pregnant and breastfeeding women: 600 International Units.  Adults over 32 years old: 3 International Units. These are minimum levels of recommended amounts. Your health care provider may recommend a different amount of vitamin D intake based on your specific needs and your overall health. Getting sun exposure  Get regular, safe exposure to natural sunlight. Expose your skin to direct sunlight for at least 15 minutes every day. If you have dark skin, you may need to expose your skin for a longer period of time.  Protect your skin from too much sun exposure. This helps to prevent skin cancer.  Ask your health care provider if regular sun exposure is safe for you.  Do not use a tanning bed. Eating and drinking   Eat foods that naturally contain vitamin D. These include: ? Beef liver. ? Egg yolk. ? Fatty fish, such as cod, salmon, trout, swordfish, shrimp, sardines, and tuna. ? Cheese. ? Mushrooms. ? Oysters.  Eat  or drink products that have been fortified with vitamin D. Fortified means that vitamin D has been added to the food. These may include: ? Cereals. ? Dairy products, such as milk, yogurt, butter, or margarine. ? Orange juice. ? Alternative milks, such as soy milk or almond milk.  When choosing foods, check the food label on the package to see: ? How much vitamin D is in the item. ? If the food is fortified with vitamin D.  Although it is hard to get your vitamin D requirement from foods alone, you should eat a balanced  diet each day that includes foods naturally higher in vitamin D or fortified with it. Try to include the following in your diet each day: ? 2-3 servings of meat or meat alternatives. ? 2-3 servings of dairy. Taking supplements If you are at risk for vitamin D deficiency, or if you have certain diseases, your health care provider may recommend that you take a vitamin D supplement. Make sure you:  Talk with your health care provider before you start taking any vitamin D supplements. You may be more sensitive to the side effects of vitamin D supplements if you are on certain medicines or have certain medical conditions.  Tell your health care provider about all medicines you are taking, including vitamin, mineral, and herbal supplements.  Take medicines and supplements only as told by your health care provider. Summary  Vitamin D is a nutrient that helps your body absorb calcium from food.  A vitamin D deficiency can put you at risk of developing conditions that cause bones to be brittle, such as rickets or osteoporosis.  Our bodies make vitamin D when our skin is exposed to direct sunlight. However, for many people, this may not be enough vitamin D to meet the body's needs.  Some foods naturally contain vitamin D, including beef liver, egg yolk, and fatty fish.  Products may also be fortified with vitamin D. Fortified means that vitamin D has been added to the food. This information is not intended to replace advice given to you by your health care provider. Make sure you discuss any questions you have with your health care provider. Document Revised: 08/12/2019 Document Reviewed: 11/14/2018 Elsevier Patient Education  [place].  Fatigue If you have fatigue, you feel tired all the time and have a lack of energy or a lack of motivation. Fatigue may make it difficult to start or complete tasks because of exhaustion. In general, occasional or mild fatigue is often a normal response to  activity or life. However, long-lasting (chronic) or extreme fatigue may be a symptom of a medical condition. Follow these instructions at home: General instructions  Watch your fatigue for any changes.  Go to bed and get up at the same time every day.  Avoid fatigue by pacing yourself during the day and getting enough sleep at night.  Maintain a healthy weight. Medicines  Take over-the-counter and prescription medicines only as told by your health care provider.  Take a multivitamin, if told by your health care provider.  Do not use herbal or dietary supplements unless they are approved by your health care provider. Activity   Exercise regularly, as told by your health care provider.  Use or practice techniques to help you relax, such as yoga, tai chi, meditation, or massage therapy. Eating and drinking   Avoid heavy meals in the evening.  Eat a well-balanced diet, which includes lean proteins, whole grains, plenty of fruits and vegetables,  and low-fat dairy products.  Avoid consuming too much caffeine.  Avoid the use of alcohol.  Drink enough fluid to keep your urine pale yellow. Lifestyle  Change situations that cause you stress. Try to keep your work and personal schedule in balance.  Do not use any products that contain nicotine or tobacco, such as cigarettes and e-cigarettes. If you need help quitting, ask your health care provider.  Do not use drugs. Contact a health care provider if:  Your fatigue does not get better.  You have a fever.  You suddenly lose or gain weight.  You have headaches.  You have trouble falling asleep or sleeping through the night.  You feel angry, guilty, anxious, or sad.  You are unable to have a bowel movement (constipation).  Your skin is dry.  You have swelling in your legs or another part of your body. Get help right away if:  You feel confused.  Your vision is blurry.  You feel faint or you pass out.  You  have a severe headache.  You have severe pain in your abdomen, your back, or the area between your waist and hips (pelvis).  You have chest pain, shortness of breath, or an irregular or fast heartbeat.  You are unable to urinate, or you urinate less than normal.  You have abnormal bleeding, such as bleeding from the rectum, vagina, nose, lungs, or nipples.  You vomit blood.  You have thoughts about hurting yourself or others. If you ever feel like you may hurt yourself or others, or have thoughts about taking your own life, get help right away. You can go to your nearest emergency department or call:  Your local emergency services (911 in the [place]  A suicide crisis helpline, such as the [facility] at 564-218-9081. This is open 24 hours a day. Summary  If you have fatigue, you feel tired all the time and have a lack of energy or a lack of motivation.  Fatigue may make it difficult to start or complete tasks because of exhaustion.  Long-lasting (chronic) or extreme fatigue may be a symptom of a medical condition.  Exercise regularly, as told by your health care provider.  Change situations that cause you stress. Try to keep your work and personal schedule in balance. This information is not intended to replace advice given to you by your health care provider. Make sure you discuss any questions you have with your health care provider. Document Revised: 06/10/2019 Document Reviewed: 08/14/2017 Elsevier Patient Education  [place].  Insomnia Insomnia is a sleep disorder that makes it difficult to fall asleep or stay asleep. Insomnia can cause fatigue, low energy, difficulty concentrating, mood swings, and poor performance at work or school. There are three different ways to classify insomnia:  Difficulty falling asleep.  Difficulty staying asleep.  Waking up too early in the morning. Any type of insomnia can be long-term (chronic) or  short-term (acute). Both are common. Short-term insomnia usually lasts for three months or less. Chronic insomnia occurs at least three times a week for longer than three months. What are the causes? Insomnia may be caused by another condition, situation, or substance, such as:  Anxiety.  Certain medicines.  Gastroesophageal reflux disease (GERD) or other gastrointestinal conditions.  Asthma or other breathing conditions.  Restless legs syndrome, sleep apnea, or other sleep disorders.  Chronic pain.  Menopause.  Stroke.  Abuse of alcohol, tobacco, or illegal drugs.  Mental health conditions, such as  depression.  Caffeine.  Neurological disorders, such as Alzheimer's disease.  An overactive thyroid (hyperthyroidism). Sometimes, the cause of insomnia may not be known. What increases the risk? Risk factors for insomnia include:  Gender. Women are affected more often than men.  Age. Insomnia is more common as you get older.  Stress.  Lack of exercise.  Irregular work schedule or working night shifts.  Traveling between different time zones.  Certain medical and mental health conditions. What are the signs or symptoms? If you have insomnia, the main symptom is having trouble falling asleep or having trouble staying asleep. This may lead to other symptoms, such as:  Feeling fatigued or having low energy.  Feeling nervous about going to sleep.  Not feeling rested in the morning.  Having trouble concentrating.  Feeling irritable, anxious, or depressed. How is this diagnosed? This condition may be diagnosed based on:  Your symptoms and medical history. Your health care provider may ask about: ? Your sleep habits. ? Any medical conditions you have. ? Your mental health.  A physical exam. How is this treated? Treatment for insomnia depends on the cause. Treatment may focus on treating an underlying condition that is causing insomnia. Treatment may also  include:  Medicines to help you sleep.  Counseling or therapy.  Lifestyle adjustments to help you sleep better. Follow these instructions at home: Eating and drinking   Limit or avoid alcohol, caffeinated beverages, and cigarettes, especially close to bedtime. These can disrupt your sleep.  Do not eat a large meal or eat spicy foods right before bedtime. This can lead to digestive discomfort that can make it hard for you to sleep. Sleep habits   Keep a sleep diary to help you and your health care provider figure out what could be causing your insomnia. Write down: ? When you sleep. ? When you wake up during the night. ? How well you sleep. ? How rested you feel the next day. ? Any side effects of medicines you are taking. ? What you eat and drink.  Make your bedroom a dark, comfortable place where it is easy to fall asleep. ? Put up shades or blackout curtains to block light from outside. ? Use a white noise machine to block noise. ? Keep the temperature cool.  Limit screen use before bedtime. This includes: ? Watching TV. ? Using your smartphone, tablet, or computer.  Stick to a routine that includes going to bed and waking up at the same times every day and night. This can help you fall asleep faster. Consider making a quiet activity, such as reading, part of your nighttime routine.  Try to avoid taking naps during the day so that you sleep better at night.  Get out of bed if you are still awake after 15 minutes of trying to sleep. Keep the lights down, but try reading or doing a quiet activity. When you feel sleepy, go back to bed. General instructions  Take over-the-counter and prescription medicines only as told by your health care provider.  Exercise regularly, as told by your health care provider. Avoid exercise starting several hours before bedtime.  Use relaxation techniques to manage stress. Ask your health care provider to suggest some techniques that may work  well for you. These may include: ? Breathing exercises. ? Routines to release muscle tension. ? Visualizing peaceful scenes.  Make sure that you drive carefully. Avoid driving if you feel very sleepy.  Keep all follow-up visits as told by your health  care provider. This is important. Contact a health care provider if:  You are tired throughout the day.  You have trouble in your daily routine due to sleepiness.  You continue to have sleep problems, or your sleep problems get worse. Get help right away if:  You have serious thoughts about hurting yourself or someone else. If you ever feel like you may hurt yourself or others, or have thoughts about taking your own life, get help right away. You can go to your nearest emergency department or call:  Your local emergency services (911 in the [place]  A suicide crisis helpline, such as the [facility] at 817-840-8559. This is open 24 hours a day. Summary  Insomnia is a sleep disorder that makes it difficult to fall asleep or stay asleep.  Insomnia can be long-term (chronic) or short-term (acute).  Treatment for insomnia depends on the cause. Treatment may focus on treating an underlying condition that is causing insomnia.  Keep a sleep diary to help you and your health care provider figure out what could be causing your insomnia. This information is not intended to replace advice given to you by your health care provider. Make sure you discuss any questions you have with your health care provider. Document Revised: 11/01/2017 Document Reviewed: 08/29/2017 Elsevier Patient Education  2020 [place].  Vitamin B12 Deficiency Vitamin B12 deficiency occurs when the body does not have enough vitamin B12, which is an important vitamin. The body needs this vitamin:  To make red blood cells.  To make DNA. This is the genetic material inside cells.  To help the nerves work properly so they can carry messages  from the brain to the body. Vitamin B12 deficiency can cause various health problems, such as a low red blood cell count (anemia) or nerve damage. What are the causes? This condition may be caused by:  Not eating enough foods that contain vitamin B12.  Not having enough stomach acid and digestive fluids to properly absorb vitamin B12 from the food that you eat.  Certain digestive system diseases that make it hard to absorb vitamin B12. These diseases include Crohn's disease, chronic pancreatitis, and cystic fibrosis.  A condition in which the body does not make enough of a protein (intrinsic factor), resulting in too few red blood cells (pernicious anemia).  Having a surgery in which part of the stomach or small intestine is removed.  Taking certain medicines that make it hard for the body to absorb vitamin B12. These medicines include: ? Heartburn medicines (antacids and proton pump inhibitors). ? Certain antibiotic medicines. ? Some medicines that are used to treat diabetes, tuberculosis, gout, or high cholesterol. What increases the risk? The following factors may make you more likely to develop a B12 deficiency:  Being older than age 66.  Eating a vegetarian or vegan diet, especially while you are pregnant.  Eating a poor diet while you are pregnant.  Taking certain medicines.  Having alcoholism. What are the signs or symptoms? In some cases, there are no symptoms of this condition. If the condition leads to anemia or nerve damage, various symptoms can occur, such as:  Weakness.  Fatigue.  Loss of appetite.  Weight loss.  Numbness or tingling in your hands and feet.  Redness and burning of the tongue.  Confusion or memory problems.  Depression.  Sensory problems, such as color blindness, ringing in the ears, or loss of taste.  Diarrhea or constipation.  Trouble walking. If anemia is  severe, symptoms can include:  Shortness of breath.  Dizziness.  Rapid  heart rate (tachycardia). How is this diagnosed? This condition may be diagnosed with a blood test to measure the level of vitamin B12 in your blood. You may also have other tests, including:  A group of tests that measure certain characteristics of blood cells (complete blood count, CBC).  A blood test to measure intrinsic factor.  A procedure where a thin tube with a camera on the end is used to look into your stomach or intestines (endoscopy). Other tests may be needed to discover the cause of B12 deficiency. How is this treated? Treatment for this condition depends on the cause. This condition may be treated by:  Changing your eating and drinking habits, such as: ? Eating more foods that contain vitamin B12. ? Drinking less alcohol or no alcohol.  Getting vitamin B12 injections.  Taking vitamin B12 supplements. Your health care provider will tell you which dosage is best for you. Follow these instructions at home: Eating and drinking   Eat lots of healthy foods that contain vitamin B12, including: ? Meats and poultry. This includes beef, pork, chicken, [place], and organ meats, such as liver. ? Seafood. This includes clams, rainbow trout, salmon, tuna, and haddock. ? Eggs. ? Cereal and dairy products that are fortified. This means that vitamin B12 has been added to the food. Check the label on the package to see if the food is fortified. The items listed above may not be a complete list of recommended foods and beverages. Contact a dietitian for more information. General instructions  Get any injections that are prescribed by your health care provider.  Take supplements only as told by your health care provider. Follow the directions carefully.  Do not drink alcohol if your health care provider tells you not to. In some cases, you may only be asked to limit alcohol use.  Keep all follow-up visits as told by your health care provider. This is important. Contact a health care  provider if:  Your symptoms come back. Get help right away if you:  Develop shortness of breath.  Have a rapid heart rate.  Have chest pain.  Become dizzy or lose consciousness. Summary  Vitamin B12 deficiency occurs when the body does not have enough vitamin B12.  The main causes of vitamin B12 deficiency include dietary deficiency, digestive diseases, pernicious anemia, and having a surgery in which part of the stomach or small intestine is removed.  In some cases, there are no symptoms of this condition. If the condition leads to anemia or nerve damage, various symptoms can occur, such as weakness, shortness of breath, and numbness.  Treatment may include getting vitamin B12 injections or taking vitamin B12 supplements. Eat lots of healthy foods that contain vitamin B12. This information is not intended to replace advice given to you by your health care provider. Make sure you discuss any questions you have with your health care provider. Document Revised: 05/08/2019 Document Reviewed: 07/29/2018 Elsevier Patient Education  2020 [place].  Vitamin D Deficiency Vitamin D deficiency is when your body does not have enough vitamin D. Vitamin D is important to your body for many reasons:  It helps the body absorb two important minerals--calcium and phosphorus.  It plays a role in bone health.  It may help to prevent some diseases, such as diabetes and multiple sclerosis.  It plays a role in muscle function, including heart function. If vitamin D deficiency is severe, it  can cause a condition in which your bones become soft. In adults, this condition is called osteomalacia. In children, this condition is called rickets. What are the causes? This condition may be caused by:  Not eating enough foods that contain vitamin D.  Not getting enough natural sun exposure.  Having certain digestive system diseases that make it difficult for your body to absorb vitamin D. These  diseases include Crohn's disease, chronic pancreatitis, and cystic fibrosis.  Having a surgery in which a part of the stomach or a part of the small intestine is removed.  Having chronic kidney disease or liver disease. What increases the risk? You are more likely to develop this condition if you:  Are older.  Do not spend much time outdoors.  Live in a long-term care facility.  Have had broken bones.  Have weak or thin bones (osteoporosis).  Have a disease or condition that changes how the body absorbs vitamin D.  Have dark skin.  Take certain medicines, such as steroid medicines or certain seizure medicines.  Are overweight or obese. What are the signs or symptoms? In mild cases of vitamin D deficiency, there may not be any symptoms. If the condition is severe, symptoms may include:  Bone pain.  Muscle pain.  Falling often.  Broken bones caused by a minor injury. How is this diagnosed? This condition may be diagnosed with blood tests. Imaging tests such as X-rays may also be done to look for changes in the bone. How is this treated? Treatment for this condition may depend on what caused the condition. Treatment options include:  Taking vitamin D supplements. Your health care provider will suggest what dose is best for you.  Taking a calcium supplement. Your health care provider will suggest what dose is best for you. Follow these instructions at home: Eating and drinking   Eat foods that contain vitamin D. Choices include: ? Fortified dairy products, cereals, or juices. Fortified means that vitamin D has been added to the food. Check the label on the package to see if the food is fortified. ? Fatty fish, such as salmon or trout. ? Eggs. ? Oysters. ? Mushrooms. The items listed above may not be a complete list of recommended foods and beverages. Contact a dietitian for more information. General instructions  Take medicines and supplements only as told by your  health care provider.  Get regular, safe exposure to natural sunlight.  Do not use a tanning bed.  Maintain a healthy weight. Lose weight if needed.  Keep all follow-up visits as told by your health care provider. This is important. How is this prevented? You can get vitamin D by:  Eating foods that naturally contain vitamin D.  Eating or drinking products that have been fortified with vitamin D, such as cereals, juices, and dairy products (including milk).  Taking a vitamin D supplement or a multivitamin supplement that contains vitamin D.  Being in the sun. Your body naturally makes vitamin D when your skin is exposed to sunlight. Your body changes the sunlight into a form of the vitamin that it can use. Contact a health care provider if:  Your symptoms do not go away.  You feel nauseous or you vomit.  You have fewer bowel movements than usual or are constipated. Summary  Vitamin D deficiency is when your body does not have enough vitamin D.  Vitamin D is important to your body for good bone health and muscle function, and it may help prevent some  diseases.  Vitamin D deficiency is primarily treated through supplementation. Your health care provider will suggest what dose is best for you.  You can get vitamin D by eating foods that contain vitamin D, by being in the sun, and by taking a vitamin D supplement or a multivitamin supplement that contains vitamin D. This information is not intended to replace advice given to you by your health care provider. Make sure you discuss any questions you have with your health care provider. Document Revised: 07/28/2018 Document Reviewed: 07/28/2018 Elsevier Patient Education  2020 [place].  Acne  Acne is a skin problem that causes pimples and other skin changes. The skin has many tiny openings called pores. Each pore contains an oil gland. Oil glands make an oily substance that is called sebum. Acne occurs when the pores in the  skin get blocked. The pores may become infected with bacteria, or they may become red, sore, and swollen. Acne is a common skin problem, especially for teenagers. It often occurs on the face, neck, chest, upper arms, and back. Acne usually goes away over time. What are the causes? Acne is caused when oil glands get blocked with sebum, dead skin cells, and dirt. The bacteria that are normally found in the oil glands then multiply and cause inflammation. Acne is commonly triggered by changes in your hormones. These hormonal changes can cause the oil glands to get bigger and to make more sebum. Factors that can make acne worse include:  Hormone changes during: ? Adolescence. ? Women's menstrual cycles. ? Pregnancy.  Oil-based cosmetics and hair products.  Stress.  Hormone problems that are caused by certain diseases.  Certain medicines.  Pressure from headbands, backpacks, or shoulder pads.  Exposure to certain oils and chemicals.  Eating a diet high in carbohydrates that quickly turn to sugar. These include dairy products, desserts, and chocolates. What increases the risk? This condition is more likely to develop in:  Teenagers.  People who have a family history of acne. What are the signs or symptoms? Symptoms include:  Small, red bumps (pimples or papules).  Whiteheads.  Blackheads.  Small, pus-filled pimples (pustules).  Big, red pimples or pustules that feel tender. More severe acne can cause:  An abscess. This is an infected area that contains a collection of pus.  Cysts. These are hard, painful, fluid-filled sacs.  Scars. These can happen after large pimples heal. How is this diagnosed? This condition is diagnosed with a medical history and physical exam. Blood tests may also be done. How is this treated? Treatment for this condition can vary depending on the severity of your acne. Treatment may include:  Creams and lotions that prevent oil glands from  clogging.  Creams and lotions that treat or prevent infections and inflammation.  Antibiotic medicines that are applied to the skin or taken as a pill.  Pills that decrease sebum production.  Birth control pills.  Light or laser treatments.  Injections of medicine into the affected areas.  Chemicals that cause peeling of the skin.  Surgery. Your health care provider will also recommend the best way to take care of your skin. Good skin care is the most important part of treatment. Follow these instructions at home: Skin care Take care of your skin as told by your health care provider. You may be told to do these things:  Wash your skin gently at least two times each day, as well as: ? After you exercise. ? Before you go to bed.  Use mild soap.  Apply a water-based skin moisturizer after you wash your skin.  Use a sunscreen or sunblock with SPF 30 or greater. This is especially important if you are using acne medicines.  Choose cosmetics that will not block your oil glands (are noncomedogenic). Medicines  Take over-the-counter and prescription medicines only as told by your health care provider.  If you were prescribed an antibiotic medicine, apply it or take it as told by your health care provider. Do not stop using the antibiotic even if your condition improves. General instructions  Keep your hair clean and off your face. If you have oily hair, shampoo your hair regularly or daily.  Avoid wearing tight headbands or hats.  Avoid picking or squeezing your pimples. That can make your acne worse and cause scarring.  Shave gently and only when necessary.  Keep a food journal to figure out if any foods are linked to your acne. Avoid dairy products, desserts, and chocolates.  Take steps to manage and reduce stress.  Keep all follow-up visits as told by your health care provider. This is important. Contact a health care provider if:  Your acne is not better after eight  weeks.  Your acne gets worse.  You have a large area of skin that is red or tender.  You think that you are having side effects from any acne medicine. Summary  Acne is a skin problem that causes pimples and other skin changes. Acne is a common skin problem, especially for teenagers. Acne usually goes away over time.  Acne is commonly triggered by changes in your hormones. There are many other causes, such as stress, diet, and certain medicines.  Follow your health care provider's instructions for how to take care of your skin. Good skin care is the most important part of treatment.  Take over-the-counter and prescription medicines only as told by your health care provider.  Contact your health care provider if you think that you are having side effects from any acne medicine. This information is not intended to replace advice given to you by your health care provider. Make sure you discuss any questions you have with your health care provider. Document Revised: 04/01/2018 Document Reviewed: 04/01/2018 Elsevier Patient Education  [place].

## 2020-09-15 NOTE — Progress Notes (Signed)
Subjective:    Patient ID: Christina Bowen, female    DOB: 05-Aug-1986, 34 y.o.   MRN: [id number]  No chief complaint on file.   HPI Patient was seen today for ongoing concerns.  Pt endorses urinary frequency over the last few days.  Was concerned for UTI as she has been holding her urine more while at work.  Pt denies nausea, vomiting, back pain, dysuria.  Pt notes increased stress.  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  Pt endorses continued fatigue.  Noticed a difference since taking vitamin B12 injections monthly.  Also taking a MVI and vitamin D 50,000 IU weekly.  Pt inquires about what can be done for acne.  Patient states she developed acne prior to the start of COVID-19 pandemic.  Notes wearing a face mask regularly makes acne worse on cheeks.  Patient using Dove soap or African soap to clean her face.  Has tried numerous over-the-counter cleansers such as cleaning clear, Aveeno, CeraVe, Cetaphil however they dried her skin out.  Pt drinking 7-8 glasses of water per day.  Does not note an increase in acne 1 year of menses.  Past Medical History:  Diagnosis Date  . HPV (human papilloma virus) anogenital infection   . LGSIL (low grade squamous intraepithelial dysplasia)   . Vitamin D deficiency disease     No Known Allergies  ROS General: Denies fever, chills, night sweats, changes in weight, changes in appetite  + fatigue, insomnia, stress HEENT: Denies headaches, ear pain, changes in vision, rhinorrhea, sore throat CV: Denies CP, palpitations, SOB, orthopnea Pulm: Denies SOB, cough, wheezing GI: Denies abdominal pain, nausea, vomiting, diarrhea, constipation GU: Denies dysuria, hematuria, vaginal discharge  + urinary frequency Msk: Denies muscle cramps, joint pains Neuro: Denies weakness, numbness, tingling Skin: Denies rashes, bruising  + acne Psych: Denies depression, anxiety, hallucinations       Objective:    Blood pressure (!) 118/92, pulse 88, temperature 97.8 F (36.6 C), temperature source Oral, height 5\' 10"  (1.778 m), weight (!) 392 lb (177.8 kg), SpO2 99 %.   Gen. Pleasant, well-nourished, in no distress, obese, normal affect   HEENT: [place]/AT, face symmetric, conjunctiva clear, no scleral icterus, PERRLA, EOMI, nares patent without drainage Lungs: no accessory muscle use Cardiovascular: RRR, no peripheral edema Musculoskeletal: No deformities, no cyanosis or clubbing, normal tone Neuro:  A&Ox3, CN II-XII intact, normal gait Skin:  Warm, no lesions/ rash.  Several close comedones visible on forehead, no cystic lesions noted.   Wt Readings from Last 3 Encounters:  05/30/20 (!) 392 lb (177.8 kg)  04/27/20 (!) 391 lb 8 oz (177.6 kg)  02/03/20 (!) 400 lb 1 oz (181.5 kg)    Lab Results  Component Value Date   WBC 10.6 (H) 08/11/2017   HGB 12.6 08/11/2017   HCT 39.1 08/11/2017   PLT 366 08/11/2017   GLUCOSE 97 05/27/2019   CHOL 163 05/30/2020   TRIG 82 05/30/2020   HDL 45 05/30/2020   LDLCALC 102 (H) 05/30/2020   ALT 21 05/27/2019   AST 17 05/27/2019   NA 140 05/27/2019   K 4.5 05/27/2019   CL 104 05/27/2019   CREATININE 0.71 05/27/2019   BUN 10 05/27/2019   CO2 21 05/27/2019   TSH 0.952 05/30/2020   HGBA1C 5.2 05/30/2020    Assessment/Plan:  Urinary frequency  -Discussed possible causes including UTI, diabetes, increase p.o. intake of water and  fluids -UA with SG 1.020 otherwise unremarkable. - Plan: Hemoglobin A1c, CMP with eGFR([facility]), POCT Urinalysis Dipstick (Automated)  Fatigue, unspecified type -Discussed possible causes including lack of sleep, OSA, thyroid dysfunction, vitamin deficiency, stress, medications -We will obtain labs -Consider sleep study -Given handout - Plan: TSH, T4, free, CBC with Differential/Platelet, Hemoglobin A1c  Vitamin D deficiency  -Vitamin D 30.9 on 05/30/2020 - Plan: Vitamin D, 25-hydroxy  Vitamin B12  deficiency -No recent vitamin B12 noted per chart review -We will obtain vitamin B12 level this visit. -If needed will continue B12 injections versus p.o. supplementation. - Plan: Vitamin B12  Acne, unspecified acne type  -Discussed cleansing face twice a day with gentle cleanser -Discussed using a daily moisturizer with an SPF of at least 15-30 -We will try BenzaClin qod to avoid drying out skin - Plan: clindamycin-benzoyl peroxide (BENZACLIN) gel  Insomnia, unspecified type -Discussed sleep hygiene -Consider home sleep study if labs normal - Plan: TSH, T4, free  Stress -PHQ-9 score 4 this visit. -GAD-7 score 7 this visit -Discussed the importance of self-care and other ways to relieve/reduce stress -Discussed the importance of setting boundaries -Given handout   F/u in 1 month  Grier Mitts, MD

## 2020-09-16 ENCOUNTER — Encounter: Payer: Self-pay | Admitting: Family Medicine

## 2020-09-16 LAB — COMPLETE METABOLIC PANEL WITH GFR
AG Ratio: 1.6 (calc) (ref 1.0–2.5)
ALT: 16 U/L (ref 6–29)
AST: 13 U/L (ref 10–30)
Albumin: 3.9 g/dL (ref 3.6–5.1)
Alkaline phosphatase (APISO): 42 U/L (ref 31–125)
BUN: 13 mg/dL (ref 7–25)
CO2: 24 mmol/L (ref 20–32)
Calcium: 8.9 mg/dL (ref 8.6–10.2)
Chloride: 106 mmol/L (ref 98–110)
Creat: 0.64 mg/dL (ref 0.50–1.10)
GFR, Est African American: 137 mL/min/{1.73_m2} (ref >=60)
GFR, Est Non African American: 118 mL/min/{1.73_m2} (ref >=60)
Globulin: 2.5 g/dL (calc) (ref 1.9–3.7)
Glucose, Bld: 86 mg/dL (ref 65–99)
Potassium: 4.3 mmol/L (ref 3.5–5.3)
Sodium: 137 mmol/L (ref 135–146)
Total Bilirubin: 0.3 mg/dL (ref 0.2–1.2)
Total Protein: 6.4 g/dL (ref 6.1–8.1)

## 2020-09-16 LAB — CBC WITH DIFFERENTIAL/PLATELET
Absolute Monocytes: 810 cells/uL (ref 200–950)
Basophils Absolute: 100 cells/uL (ref 0–200)
Basophils Relative: 0.9 %
Eosinophils Absolute: 255 cells/uL (ref 15–500)
Eosinophils Relative: 2.3 %
HCT: 39.3 % (ref 35.0–45.0)
Hemoglobin: 13.1 g/dL (ref 11.7–15.5)
Lymphs Abs: 4407 cells/uL — ABNORMAL HIGH (ref 850–3900)
MCH: 31.9 pg (ref 27.0–33.0)
MCHC: 33.3 g/dL (ref 32.0–36.0)
MCV: 95.6 fL (ref 80.0–100.0)
MPV: 9.6 fL (ref 7.5–12.5)
Monocytes Relative: 7.3 %
Neutro Abs: 5528 cells/uL (ref 1500–7800)
Neutrophils Relative %: 49.8 %
Platelets: 435 10*3/uL — ABNORMAL HIGH (ref 140–400)
RBC: 4.11 10*6/uL (ref 3.80–5.10)
RDW: 12.1 % (ref 11.0–15.0)
Total Lymphocyte: 39.7 %
WBC: 11.1 10*3/uL — ABNORMAL HIGH (ref 3.8–10.8)

## 2020-09-16 LAB — VITAMIN D 25 HYDROXY (VIT D DEFICIENCY, FRACTURES): Vit D, 25-Hydroxy: 32 ng/mL (ref 30–100)

## 2020-09-16 LAB — HEMOGLOBIN A1C
Hgb A1c MFr Bld: 5 % of total Hgb (ref <5.7)
Mean Plasma Glucose: 97 (calc)
eAG (mmol/L): 5.4 (calc)

## 2020-09-16 LAB — VITAMIN B12: Vitamin B-12: 640 pg/mL (ref 200–1100)

## 2020-09-16 LAB — T4, FREE: Free T4: 1.1 ng/dL (ref 0.8–1.8)

## 2020-09-16 LAB — TSH: TSH: 0.76 mIU/L

## 2020-09-20 ENCOUNTER — Other Ambulatory Visit: Payer: Self-pay

## 2020-09-20 DIAGNOSIS — D72829 Elevated white blood cell count, unspecified: Secondary | ICD-10-CM

## 2020-09-28 ENCOUNTER — Other Ambulatory Visit: Payer: BC Managed Care – PPO

## 2020-09-29 ENCOUNTER — Encounter: Payer: Self-pay | Admitting: Family Medicine

## 2020-09-30 ENCOUNTER — Other Ambulatory Visit: Payer: Self-pay

## 2020-09-30 ENCOUNTER — Other Ambulatory Visit: Payer: BC Managed Care – PPO

## 2020-09-30 DIAGNOSIS — D72829 Elevated white blood cell count, unspecified: Secondary | ICD-10-CM

## 2020-10-01 LAB — CBC WITH DIFFERENTIAL/PLATELET
Absolute Monocytes: 632 cells/uL (ref 200–950)
Basophils Absolute: 94 cells/uL (ref 0–200)
Basophils Relative: 0.8 %
Eosinophils Absolute: 293 cells/uL (ref 15–500)
Eosinophils Relative: 2.5 %
HCT: 36.1 % (ref 35.0–45.0)
Hemoglobin: 12.4 g/dL (ref 11.7–15.5)
Lymphs Abs: 4118 cells/uL — ABNORMAL HIGH (ref 850–3900)
MCH: 32.3 pg (ref 27.0–33.0)
MCHC: 34.3 g/dL (ref 32.0–36.0)
MCV: 94 fL (ref 80.0–100.0)
MPV: 9.8 fL (ref 7.5–12.5)
Monocytes Relative: 5.4 %
Neutro Abs: 6564 cells/uL (ref 1500–7800)
Neutrophils Relative %: 56.1 %
Platelets: 441 10*3/uL — ABNORMAL HIGH (ref 140–400)
RBC: 3.84 10*6/uL (ref 3.80–5.10)
RDW: 12 % (ref 11.0–15.0)
Total Lymphocyte: 35.2 %
WBC: 11.7 10*3/uL — ABNORMAL HIGH (ref 3.8–10.8)

## 2020-10-03 ENCOUNTER — Other Ambulatory Visit: Payer: BC Managed Care – PPO

## 2020-10-05 ENCOUNTER — Encounter: Payer: Self-pay | Admitting: Family Medicine

## 2020-10-05 ENCOUNTER — Ambulatory Visit (INDEPENDENT_AMBULATORY_CARE_PROVIDER_SITE_OTHER): Payer: BC Managed Care – PPO

## 2020-10-05 ENCOUNTER — Ambulatory Visit: Payer: BC Managed Care – PPO | Admitting: Family Medicine

## 2020-10-05 ENCOUNTER — Other Ambulatory Visit: Payer: Self-pay

## 2020-10-05 VITALS — BP 120/86 | HR 91 | Temp 97.9°F | Wt 393.0 lb

## 2020-10-05 DIAGNOSIS — R5383 Other fatigue: Secondary | ICD-10-CM

## 2020-10-05 DIAGNOSIS — D72820 Lymphocytosis (symptomatic): Secondary | ICD-10-CM

## 2020-10-05 DIAGNOSIS — R635 Abnormal weight gain: Secondary | ICD-10-CM

## 2020-10-05 DIAGNOSIS — G8929 Other chronic pain: Secondary | ICD-10-CM | POA: Diagnosis not present

## 2020-10-05 DIAGNOSIS — M25572 Pain in left ankle and joints of left foot: Secondary | ICD-10-CM

## 2020-10-05 DIAGNOSIS — R591 Generalized enlarged lymph nodes: Secondary | ICD-10-CM | POA: Diagnosis not present

## 2020-10-05 DIAGNOSIS — R21 Rash and other nonspecific skin eruption: Secondary | ICD-10-CM | POA: Diagnosis not present

## 2020-10-05 DIAGNOSIS — L709 Acne, unspecified: Secondary | ICD-10-CM | POA: Diagnosis not present

## 2020-10-05 MED ORDER — PHENTERMINE HCL 37.5 MG PO TABS: 37.5000 mg | ORAL_TABLET | ORAL | 0 refills | Status: DC | PRN

## 2020-10-05 NOTE — Progress Notes (Signed)
Subjective:    Patient ID: Christina Bowen, female    DOB: 16-Sep-1986, 34 y.o.   MRN: [id number]  No chief complaint on file.   HPI Patient was seen today for follow-up.  Patient endorses continued acne/on face.  Worse in the area underneath mask.  Patient states face became worse with blisterlike weeping, crusted, yellow skin lesions.  Patient provided a picture on MyChart.  BenzaClin dried patient skin out.  Cortisone cream helped heal rash.  Patient decreased intake of dairy to see if it would help symptoms.  Patient also endorses fatigue, weight gain, left ankle pain.  Patient with left ankle pain starts in the morning and stiffness and improves as the day goes on.  Patient notes occasional soreness in the right wrist.  Patient denies history of autoimmune disease.  Also here for repeat CBC as lymphocytosis noted during last OFV.  Patient requesting refill on phentermine.  Restart that medication a few months ago was 409 lbs.  Now 393 lbs.  Past Medical History:  Diagnosis Date  . HPV (human papilloma virus) anogenital infection   . LGSIL (low grade squamous intraepithelial dysplasia)   . Vitamin D deficiency disease     No Known Allergies  ROS General: Denies fever, chills, night sweats, changes in weight, changes in appetite  + changes in weight, fatigue HEENT: Denies headaches, ear pain, changes in vision, rhinorrhea, sore throat CV: Denies CP, palpitations, SOB, orthopnea Pulm: Denies SOB, cough, wheezing GI: Denies abdominal pain, nausea, vomiting, diarrhea, constipation GU: Denies dysuria, hematuria, frequency, vaginal discharge Msk: Denies muscle cramps, joint pains  + left ankle pain, right wrist Neuro: Denies weakness, numbness, tingling Skin: Denies rashes, bruising  +rash on face Psych: Denies depression, anxiety, hallucinations      Objective:    Blood pressure 120/86, pulse 91, temperature 97.9 F (36.6 C), temperature source Oral, weight (!) 393 lb (178.3 kg),  SpO2 99 %.  Gen. Pleasant, well-nourished, in no distress, normal affect   HEENT: [place]/AT, face symmetric, conjunctiva clear, no scleral icterus, PERRLA, EOMI, nares patent without drainage Lungs: no accessory muscle use Cardiovascular: RRR, no m/r/g, no peripheral edema Musculoskeletal: No deformities, no cyanosis or clubbing, normal tone Neuro:  A&Ox3, CN II-XII intact, normal gait Skin:  Warm, dry, intact.  Picture below provided by patient on 09/29/2020.  At today's visit: Skin warm, dry, intact with a few comedones on forehead and bilateral cheeks.  No rashes, hypopigmentation, or lesions across nose or the nasolabial fold      Wt Readings from Last 3 Encounters:  10/05/20 (!) 393 lb (178.3 kg)  09/15/20 (!) 392 lb (177.8 kg)  05/30/20 (!) 392 lb (177.8 kg)    Lab Results  Component Value Date   WBC 11.7 (H) 09/30/2020   HGB 12.4 09/30/2020   HCT 36.1 09/30/2020   PLT 441 (H) 09/30/2020   GLUCOSE 86 09/15/2020   CHOL 163 05/30/2020   TRIG 82 05/30/2020   HDL 45 05/30/2020   LDLCALC 102 (H) 05/30/2020   ALT 16 09/15/2020   AST 13 09/15/2020   NA 137 09/15/2020   K 4.3 09/15/2020   CL 106 09/15/2020   CREATININE 0.64 09/15/2020   BUN 13 09/15/2020   CO2 24 09/15/2020   TSH 0.76 09/15/2020   HGBA1C 5.0 09/15/2020    Assessment/Plan:  Lymphocytosis -WBC count mildly elevated at 11.1 and 11.7 on recent labs. -We will recheck this value -For continued pancytopenia will place referral to heme/onc - Plan: Sedimentation Rate,  C-reactive Protein, DG Chest 2 View, CBC with Differential/Platelet, Protein / Creatinine Ratio, Urine, C3 and C4, ANA, Anti-DNA antibody, double-stranded, Sjogrens syndrome-B extractable nuclear antibody, Lupus (SLE) Analysis  Weight gain -Patient encouraged to increase physical activity and decrease portion sizes -We will continue phentermine 37.5 mg as patient has lost overall 4% of initial weight.  Was 409 LBS - Plan: ANA, phentermine  (ADIPEX-P) 37.5 MG tablet, ANA, Lupus (SLE) Analysis  Chronic pain of left ankle  - Plan: Sedimentation Rate, C-reactive Protein, CBC with Differential/Platelet, ANA, Sjogrens syndrome-B extractable nuclear antibody, Sjogrens syndrome-B extractable nuclear antibody, ANA, CBC with Differential/Platelet, C-reactive Protein, Sedimentation Rate, Lupus (SLE) Analysis  Acne, unspecified acne type -Continue supportive care  Rash -Concern for possible autoimmune disease given recent appearance of rash -We will obtain labs -Continue supportive care -Consider follow-up with dermatology  - Plan: Sedimentation Rate, C-reactive Protein, CBC with Differential/Platelet, Protein / Creatinine Ratio, Urine, C3 and C4, ANA, Anti-DNA antibody, double-stranded, Sjogrens syndrome-B extractable nuclear antibody   Fatigue, unspecified type  - Plan: Sedimentation Rate, C-reactive Protein, DG Chest 2 View, CBC with Differential/Platelet, Protein / Creatinine Ratio, Urine, C3 and C4, ANA, Anti-DNA antibody, double-stranded, Sjogrens syndrome-B extractable nuclear antibody  F/u in 1 month  Grier Mitts, MD

## 2020-10-05 NOTE — Patient Instructions (Addendum)
You should check your blood pressure daily to make sure it stays under 130/80 while you are on phentermine.  Continue to increase your physical activity and intake of water.  Your vitamin D level was checked at last office visit and it was 32.  Continue taking the weekly vitamin D supplementation.  Your B12 level at that time was 640 which is in good range.  Continue taking daily multivitamin or prenatal vitamin to help keep this number where it sat.   Rash, Adult A rash is a change in the color of your skin. A rash can also change the way your skin feels. There are many different conditions and factors that can cause a rash. Some rashes may disappear after a few days, but some may last for a few weeks. Common causes of rashes include:  Viral infections, such as: ? Colds. ? Measles. ? Hand, foot, and mouth disease.  Bacterial infections, such as: ? Scarlet fever. ? Impetigo.  Fungal infections, such as Candida.  Allergic reactions to food, medicines, or skin care products. Follow these instructions at home: The goal of treatment is to stop the itching and keep the rash from spreading. Pay attention to any changes in your symptoms. Follow these instructions to help with your condition: Medicine Take or apply over-the-counter and prescription medicines only as told by your health care provider. These may include:  Corticosteroid creams to treat red or swollen skin.  Anti-itch lotions.  Oral allergy medicines (antihistamines).  Oral corticosteroids for severe symptoms.  Skin care  Apply cool compresses to the affected areas.  Do not scratch or rub your skin.  Avoid covering the rash. Make sure the rash is exposed to air as much as possible. Managing itching and discomfort  Avoid hot showers or baths, which can make itching worse. A cold shower may help.  Try taking a bath with: ? Epsom salts. Follow manufacturer instructions on the packaging. You can get these at your local  pharmacy or grocery store. ? Baking soda. Pour a small amount into the bath as told by your health care provider. ? Colloidal oatmeal. Follow manufacturer instructions on the packaging. You can get this at your local pharmacy or grocery store.  Try applying baking soda paste to your skin. Stir water into baking soda until it reaches a paste-like consistency.  Try applying calamine lotion. This is an over-the-counter lotion that helps to relieve itchiness.  Keep cool and out of the sun. Sweating and being hot can make itching worse. General instructions   Rest as needed.  Drink enough fluid to keep your urine pale yellow.  Wear loose-fitting clothing.  Avoid scented soaps, detergents, and perfumes. Use gentle soaps, detergents, perfumes, and other cosmetic products.  Avoid any substance that causes your rash. Keep a journal to help track what causes your rash. Write down: ? What you eat. ? What cosmetic products you use. ? What you drink. ? What you wear. This includes jewelry.  Keep all follow-up visits as told by your health care provider. This is important. Contact a health care provider if:  You sweat at night.  You lose weight.  You urinate more than normal.  You urinate less than normal, or you notice that your urine is a darker color than usual.  You feel weak.  You vomit.  Your skin or the whites of your eyes look yellow (jaundice).  Your skin: ? Tingles. ? Is numb.  Your rash: ? Does not go away after several  days. ? Gets worse.  You are: ? Unusually thirsty. ? More tired than normal.  You have: ? New symptoms. ? Pain in your abdomen. ? A fever. ? Diarrhea. Get help right away if you:  Have a fever and your symptoms suddenly get worse.  Develop confusion.  Have a severe headache or a stiff neck.  Have severe joint pains or stiffness.  Have a seizure.  Develop a rash that covers all or most of your body. The rash may or may not be  painful.  Develop blisters that: ? Are on top of the rash. ? Grow larger or grow together. ? Are painful. ? Are inside your nose or mouth.  Develop a rash that: ? Looks like purple pinprick-sized spots all over your body. ? Has a "bull's eye" or looks like a target. ? Is not related to sun exposure, is red and painful, and causes your skin to peel. Summary  A rash is a change in the color of your skin. Some rashes disappear after a few days, but some may last for a few weeks.  The goal of treatment is to stop the itching and keep the rash from spreading.  Take or apply over-the-counter and prescription medicines only as told by your health care provider.  Contact a health care provider if you have new or worsening symptoms.  Keep all follow-up visits as told by your health care provider. This is important. This information is not intended to replace advice given to you by your health care provider. Make sure you discuss any questions you have with your health care provider. Document Revised: 03/13/2019 Document Reviewed: 06/23/2018 Elsevier Patient Education  2020 [place].  Acne  Acne is a skin problem that causes pimples and other skin changes. The skin has many tiny openings called pores. Each pore contains an oil gland. Oil glands make an oily substance that is called sebum. Acne occurs when the pores in the skin get blocked. The pores may become infected with bacteria, or they may become red, sore, and swollen. Acne is a common skin problem, especially for teenagers. It often occurs on the face, neck, chest, upper arms, and back. Acne usually goes away over time. What are the causes? Acne is caused when oil glands get blocked with sebum, dead skin cells, and dirt. The bacteria that are normally found in the oil glands then multiply and cause inflammation. Acne is commonly triggered by changes in your hormones. These hormonal changes can cause the oil glands to get bigger and  to make more sebum. Factors that can make acne worse include:  Hormone changes during: ? Adolescence. ? Women's menstrual cycles. ? Pregnancy.  Oil-based cosmetics and hair products.  Stress.  Hormone problems that are caused by certain diseases.  Certain medicines.  Pressure from headbands, backpacks, or shoulder pads.  Exposure to certain oils and chemicals.  Eating a diet high in carbohydrates that quickly turn to sugar. These include dairy products, desserts, and chocolates. What increases the risk? This condition is more likely to develop in:  Teenagers.  People who have a family history of acne. What are the signs or symptoms? Symptoms include:  Small, red bumps (pimples or papules).  Whiteheads.  Blackheads.  Small, pus-filled pimples (pustules).  Big, red pimples or pustules that feel tender. More severe acne can cause:  An abscess. This is an infected area that contains a collection of pus.  Cysts. These are hard, painful, fluid-filled sacs.  Scars. These  can happen after large pimples heal. How is this diagnosed? This condition is diagnosed with a medical history and physical exam. Blood tests may also be done. How is this treated? Treatment for this condition can vary depending on the severity of your acne. Treatment may include:  Creams and lotions that prevent oil glands from clogging.  Creams and lotions that treat or prevent infections and inflammation.  Antibiotic medicines that are applied to the skin or taken as a pill.  Pills that decrease sebum production.  Birth control pills.  Light or laser treatments.  Injections of medicine into the affected areas.  Chemicals that cause peeling of the skin.  Surgery. Your health care provider will also recommend the best way to take care of your skin. Good skin care is the most important part of treatment. Follow these instructions at home: Skin care Take care of your skin as told by your  health care provider. You may be told to do these things:  Wash your skin gently at least two times each day, as well as: ? After you exercise. ? Before you go to bed.  Use mild soap.  Apply a water-based skin moisturizer after you wash your skin.  Use a sunscreen or sunblock with SPF 30 or greater. This is especially important if you are using acne medicines.  Choose cosmetics that will not block your oil glands (are noncomedogenic). Medicines  Take over-the-counter and prescription medicines only as told by your health care provider.  If you were prescribed an antibiotic medicine, apply it or take it as told by your health care provider. Do not stop using the antibiotic even if your condition improves. General instructions  Keep your hair clean and off your face. If you have oily hair, shampoo your hair regularly or daily.  Avoid wearing tight headbands or hats.  Avoid picking or squeezing your pimples. That can make your acne worse and cause scarring.  Shave gently and only when necessary.  Keep a food journal to figure out if any foods are linked to your acne. Avoid dairy products, desserts, and chocolates.  Take steps to manage and reduce stress.  Keep all follow-up visits as told by your health care provider. This is important. Contact a health care provider if:  Your acne is not better after eight weeks.  Your acne gets worse.  You have a large area of skin that is red or tender.  You think that you are having side effects from any acne medicine. Summary  Acne is a skin problem that causes pimples and other skin changes. Acne is a common skin problem, especially for teenagers. Acne usually goes away over time.  Acne is commonly triggered by changes in your hormones. There are many other causes, such as stress, diet, and certain medicines.  Follow your health care provider's instructions for how to take care of your skin. Good skin care is the most important part  of treatment.  Take over-the-counter and prescription medicines only as told by your health care provider.  Contact your health care provider if you think that you are having side effects from any acne medicine. This information is not intended to replace advice given to you by your health care provider. Make sure you discuss any questions you have with your health care provider. Document Revised: 04/01/2018 Document Reviewed: 04/01/2018 Elsevier Patient Education  [place].  Fatigue If you have fatigue, you feel tired all the time and have a lack of energy  or a lack of motivation. Fatigue may make it difficult to start or complete tasks because of exhaustion. In general, occasional or mild fatigue is often a normal response to activity or life. However, long-lasting (chronic) or extreme fatigue may be a symptom of a medical condition. Follow these instructions at home: General instructions  Watch your fatigue for any changes.  Go to bed and get up at the same time every day.  Avoid fatigue by pacing yourself during the day and getting enough sleep at night.  Maintain a healthy weight. Medicines  Take over-the-counter and prescription medicines only as told by your health care provider.  Take a multivitamin, if told by your health care provider.  Do not use herbal or dietary supplements unless they are approved by your health care provider. Activity   Exercise regularly, as told by your health care provider.  Use or practice techniques to help you relax, such as yoga, tai chi, meditation, or massage therapy. Eating and drinking   Avoid heavy meals in the evening.  Eat a well-balanced diet, which includes lean proteins, whole grains, plenty of fruits and vegetables, and low-fat dairy products.  Avoid consuming too much caffeine.  Avoid the use of alcohol.  Drink enough fluid to keep your urine pale yellow. Lifestyle  Change situations that cause you stress.  Try to keep your work and personal schedule in balance.  Do not use any products that contain nicotine or tobacco, such as cigarettes and e-cigarettes. If you need help quitting, ask your health care provider.  Do not use drugs. Contact a health care provider if:  Your fatigue does not get better.  You have a fever.  You suddenly lose or gain weight.  You have headaches.  You have trouble falling asleep or sleeping through the night.  You feel angry, guilty, anxious, or sad.  You are unable to have a bowel movement (constipation).  Your skin is dry.  You have swelling in your legs or another part of your body. Get help right away if:  You feel confused.  Your vision is blurry.  You feel faint or you pass out.  You have a severe headache.  You have severe pain in your abdomen, your back, or the area between your waist and hips (pelvis).  You have chest pain, shortness of breath, or an irregular or fast heartbeat.  You are unable to urinate, or you urinate less than normal.  You have abnormal bleeding, such as bleeding from the rectum, vagina, nose, lungs, or nipples.  You vomit blood.  You have thoughts about hurting yourself or others. If you ever feel like you may hurt yourself or others, or have thoughts about taking your own life, get help right away. You can go to your nearest emergency department or call:  Your local emergency services (911 in the [place]  A suicide crisis helpline, such as the [facility] at (918)589-3657. This is open 24 hours a day. Summary  If you have fatigue, you feel tired all the time and have a lack of energy or a lack of motivation.  Fatigue may make it difficult to start or complete tasks because of exhaustion.  Long-lasting (chronic) or extreme fatigue may be a symptom of a medical condition.  Exercise regularly, as told by your health care provider.  Change situations that cause you stress. Try to  keep your work and personal schedule in balance. This information is not intended to replace advice given to  you by your health care provider. Make sure you discuss any questions you have with your health care provider. Document Revised: 06/10/2019 Document Reviewed: 08/14/2017 Elsevier Patient Education  2020 [employer].  Exercising to Lose Weight Exercise is structured, repetitive physical activity to improve fitness and health. Getting regular exercise is important for everyone. It is especially important if you are overweight. Being overweight increases your risk of heart disease, stroke, diabetes, high blood pressure, and several types of cancer. Reducing your calorie intake and exercising can help you lose weight. Exercise is usually categorized as moderate or vigorous intensity. To lose weight, most people need to do a certain amount of moderate-intensity or vigorous-intensity exercise each week. Moderate-intensity exercise  Moderate-intensity exercise is any activity that gets you moving enough to burn at least three times more energy (calories) than if you were sitting. Examples of moderate exercise include:  Walking a mile in 15 minutes.  Doing light yard work.  Biking at an easy pace. Most people should get at least 150 minutes (2 hours and 30 minutes) a week of moderate-intensity exercise to maintain their body weight. Vigorous-intensity exercise Vigorous-intensity exercise is any activity that gets you moving enough to burn at least six times more calories than if you were sitting. When you exercise at this intensity, you should be working hard enough that you are not able to carry on a conversation. Examples of vigorous exercise include:  Running.  Playing a team sport, such as football, basketball, and soccer.  Jumping rope. Most people should get at least 75 minutes (1 hour and 15 minutes) a week of vigorous-intensity exercise to maintain their body weight. How can  exercise affect me? When you exercise enough to burn more calories than you eat, you lose weight. Exercise also reduces body fat and builds muscle. The more muscle you have, the more calories you burn. Exercise also:  Improves mood.  Reduces stress and tension.  Improves your overall fitness, flexibility, and endurance.  Increases bone strength. The amount of exercise you need to lose weight depends on:  Your age.  The type of exercise.  Any health conditions you have.  Your overall physical ability. Talk to your health care provider about how much exercise you need and what types of activities are safe for you. What actions can I take to lose weight? Nutrition   Make changes to your diet as told by your health care provider or diet and nutrition specialist (dietitian). This may include: ? Eating fewer calories. ? Eating more protein. ? Eating less unhealthy fats. ? Eating a diet that includes fresh fruits and vegetables, whole grains, low-fat dairy products, and lean protein. ? Avoiding foods with added fat, salt, and sugar.  Drink plenty of water while you exercise to prevent dehydration or heat stroke. Activity  Choose an activity that you enjoy and set realistic goals. Your health care provider can help you make an exercise plan that works for you.  Exercise at a moderate or vigorous intensity most days of the week. ? The intensity of exercise may vary from person to person. You can tell how intense a workout is for you by paying attention to your breathing and heartbeat. Most people will notice their breathing and heartbeat get faster with more intense exercise.  Do resistance training twice each week, such as: ? Push-ups. ? Sit-ups. ? Lifting weights. ? Using resistance bands.  Getting short amounts of exercise can be just as helpful as long structured periods of exercise.  If you have trouble finding time to exercise, try to include exercise in your daily  routine. ? Get up, stretch, and walk around every 30 minutes throughout the day. ? Go for a walk during your lunch break. ? Park your car farther away from your destination. ? If you take public transportation, get off one stop early and walk the rest of the way. ? Make phone calls while standing up and walking around. ? Take the stairs instead of elevators or escalators.  Wear comfortable clothes and shoes with good support.  Do not exercise so much that you hurt yourself, feel dizzy, or get very short of breath. Where to find more information  [place] Department of Health and Human Services: [employer]  Centers for Disease Control and Prevention (CDC): http://www.wolf.info/ Contact a health care provider:  Before starting a new exercise program.  If you have questions or concerns about your weight.  If you have a medical problem that keeps you from exercising. Get help right away if you have any of the following while exercising:  Injury.  Dizziness.  Difficulty breathing or shortness of breath that does not go away when you stop exercising.  Chest pain.  Rapid heartbeat. Summary  Being overweight increases your risk of heart disease, stroke, diabetes, high blood pressure, and several types of cancer.  Losing weight happens when you burn more calories than you eat.  Reducing the amount of calories you eat in addition to getting regular moderate or vigorous exercise each week helps you lose weight. This information is not intended to replace advice given to you by your health care provider. Make sure you discuss any questions you have with your health care provider. Document Revised: 12/02/2017 Document Reviewed: 12/02/2017 Elsevier Patient Education  2020 [employer].

## 2020-10-06 LAB — CBC WITH DIFFERENTIAL/PLATELET
Absolute Monocytes: 545 cells/uL (ref 200–950)
Basophils Absolute: 111 cells/uL (ref 0–200)
Basophils Relative: 1.1 %
Eosinophils Absolute: 182 cells/uL (ref 15–500)
Eosinophils Relative: 1.8 %
HCT: 41.9 % (ref 35.0–45.0)
Hemoglobin: 14.1 g/dL (ref 11.7–15.5)
Lymphs Abs: 2929 cells/uL (ref 850–3900)
MCH: 32 pg (ref 27.0–33.0)
MCHC: 33.7 g/dL (ref 32.0–36.0)
MCV: 95 fL (ref 80.0–100.0)
MPV: 9.4 fL (ref 7.5–12.5)
Monocytes Relative: 5.4 %
Neutro Abs: 6333 cells/uL (ref 1500–7800)
Neutrophils Relative %: 62.7 %
Platelets: 453 10*3/uL — ABNORMAL HIGH (ref 140–400)
RBC: 4.41 10*6/uL (ref 3.80–5.10)
RDW: 12.1 % (ref 11.0–15.0)
Total Lymphocyte: 29 %
WBC: 10.1 10*3/uL (ref 3.8–10.8)

## 2020-10-06 LAB — PROTEIN / CREATININE RATIO, URINE
Creatinine, Urine: 103 mg/dL (ref 20–275)
Protein/Creat Ratio: 49 mg/g creat (ref 21–161)
Protein/Creatinine Ratio: 0.049 mg/mg creat (ref 0.021–0.16)
Total Protein, Urine: 5 mg/dL (ref 5–24)

## 2020-10-06 LAB — ANTI-DNA ANTIBODY, DOUBLE-STRANDED: ds DNA Ab: <1 IU/mL

## 2020-10-06 LAB — SEDIMENTATION RATE: Sed Rate: 2 mm/h (ref 0–20)

## 2020-10-06 LAB — ANA: Anti Nuclear Antibody (ANA): NEGATIVE

## 2020-10-06 LAB — C3 AND C4
C3 Complement: 158 mg/dL (ref 83–193)
C4 Complement: 39 mg/dL (ref 15–57)

## 2020-10-06 LAB — SJOGRENS SYNDROME-B EXTRACTABLE NUCLEAR ANTIBODY: SSB (La) (ENA) Antibody, IgG: <1 AI

## 2020-10-06 LAB — C-REACTIVE PROTEIN: CRP: 3.4 mg/L (ref <8.0)

## 2020-10-07 LAB — LUPUS (SLE) ANALYSIS
Anti Nuclear Antibody (ANA): NEGATIVE
Anti-striation Abs: NEGATIVE
Complement C4, Serum: 35 mg/dL (ref 12–38)
ENA RNP Ab: 0.2 AI (ref 0.0–0.9)
ENA SM Ab Ser-aCnc: <0.2 AI (ref 0.0–0.9)
ENA SSA (RO) Ab: <0.2 AI (ref 0.0–0.9)
ENA SSB (LA) Ab: <0.2 AI (ref 0.0–0.9)
Mitochondrial Ab: <20 Units (ref 0.0–20.0)
Parietal Cell Ab: 41.7 Units — ABNORMAL HIGH (ref 0.0–20.0)
Scleroderma (Scl-70) (ENA) Antibody, IgG: <0.2 AI (ref 0.0–0.9)
Smooth Muscle Ab: 4 Units (ref 0–19)
Thyroperoxidase Ab SerPl-aCnc: <8 IU/mL (ref 0–34)
dsDNA Ab: <1 IU/mL (ref 0–9)

## 2020-10-11 ENCOUNTER — Other Ambulatory Visit: Payer: Self-pay | Admitting: Family Medicine

## 2020-10-11 DIAGNOSIS — L709 Acne, unspecified: Secondary | ICD-10-CM

## 2020-10-12 ENCOUNTER — Encounter: Payer: Self-pay | Admitting: Family Medicine

## 2020-10-15 ENCOUNTER — Other Ambulatory Visit: Payer: Self-pay | Admitting: Family Medicine

## 2020-10-15 DIAGNOSIS — L709 Acne, unspecified: Secondary | ICD-10-CM

## 2020-10-17 ENCOUNTER — Ambulatory Visit: Payer: BC Managed Care – PPO | Admitting: Family Medicine

## 2020-10-17 NOTE — Telephone Encounter (Signed)
Refill too soon, Pt has a f/u appointment on 10/21/2020

## 2020-10-19 ENCOUNTER — Encounter: Payer: Self-pay | Admitting: Family Medicine

## 2020-10-19 NOTE — Telephone Encounter (Signed)
Patient called back and stated that she is having a flare up and really needs medication for her face. Pt is requesting a call back, please advise. CB [phone]

## 2020-10-19 NOTE — Telephone Encounter (Signed)
Pt was seen on 10/05/2020. Pt had a f/u appointment scheduled for 12/22/2019 but cancelled. Please advise if on to send refill

## 2020-10-21 ENCOUNTER — Ambulatory Visit: Payer: BC Managed Care – PPO | Admitting: Family Medicine

## 2020-10-24 ENCOUNTER — Other Ambulatory Visit: Payer: Self-pay

## 2020-10-24 DIAGNOSIS — L709 Acne, unspecified: Secondary | ICD-10-CM

## 2020-10-24 MED ORDER — CLINDAMYCIN PHOS-BENZOYL PEROX 1-5 % EX GEL: CUTANEOUS | 0 refills | Status: DC

## 2020-10-24 NOTE — Telephone Encounter (Signed)
Patient is returning call, please advise. CB is [phone]

## 2020-12-25 ENCOUNTER — Other Ambulatory Visit: Payer: Self-pay | Admitting: Family Medicine

## 2020-12-25 DIAGNOSIS — R635 Abnormal weight gain: Secondary | ICD-10-CM

## 2020-12-26 NOTE — Telephone Encounter (Signed)
Pt LOV was on 10/05/2020 and last refill was done on 10/05/2020 for 60 tablets, please advise if ok to refill

## 2020-12-29 ENCOUNTER — Encounter: Payer: Self-pay | Admitting: Family Medicine

## 2020-12-30 NOTE — Telephone Encounter (Signed)
Rx sent in on 12/29/2020

## 2021-01-20 ENCOUNTER — Other Ambulatory Visit: Payer: Self-pay

## 2021-01-20 MED ORDER — CYANOCOBALAMIN 1000 MCG/ML IJ SOLN
INTRAMUSCULAR | 1 refills | Status: DC
Start: 2021-01-20 — End: 2021-03-24

## 2021-02-26 DIAGNOSIS — Z111 Encounter for screening for respiratory tuberculosis: Secondary | ICD-10-CM | POA: Diagnosis not present

## 2021-02-28 DIAGNOSIS — Z111 Encounter for screening for respiratory tuberculosis: Secondary | ICD-10-CM | POA: Diagnosis not present

## 2021-03-21 ENCOUNTER — Other Ambulatory Visit: Payer: Self-pay | Admitting: Certified Nurse Midwife

## 2021-03-24 ENCOUNTER — Other Ambulatory Visit: Payer: Self-pay | Admitting: Certified Nurse Midwife

## 2021-03-24 MED ORDER — CYANOCOBALAMIN 1000 MCG/ML IJ SOLN
INTRAMUSCULAR | 5 refills | Status: DC
Start: 2021-03-24 — End: 2022-05-18

## 2021-03-24 MED ORDER — VITAMIN D (ERGOCALCIFEROL) 1.25 MG (50000 UNIT) PO CAPS
1.0000 | ORAL_CAPSULE | ORAL | 5 refills | Status: DC
Start: 2021-03-24 — End: 2021-10-24

## 2021-05-03 ENCOUNTER — Other Ambulatory Visit: Payer: Self-pay | Admitting: Family Medicine

## 2021-05-03 DIAGNOSIS — R635 Abnormal weight gain: Secondary | ICD-10-CM

## 2021-06-06 ENCOUNTER — Encounter: Payer: Commercial Managed Care - PPO | Admitting: Certified Nurse Midwife

## 2021-08-05 IMAGING — DX DG CHEST 2V
2 series · 2 of 2 positions shown · non-contrast
Comparison: Chest radiograph 08/21/2017

CLINICAL DATA: R/O Hilar lymphadenopathy. Patient states she is not
having any issues.

EXAM:
CHEST - 2 VIEW

[chest pa]
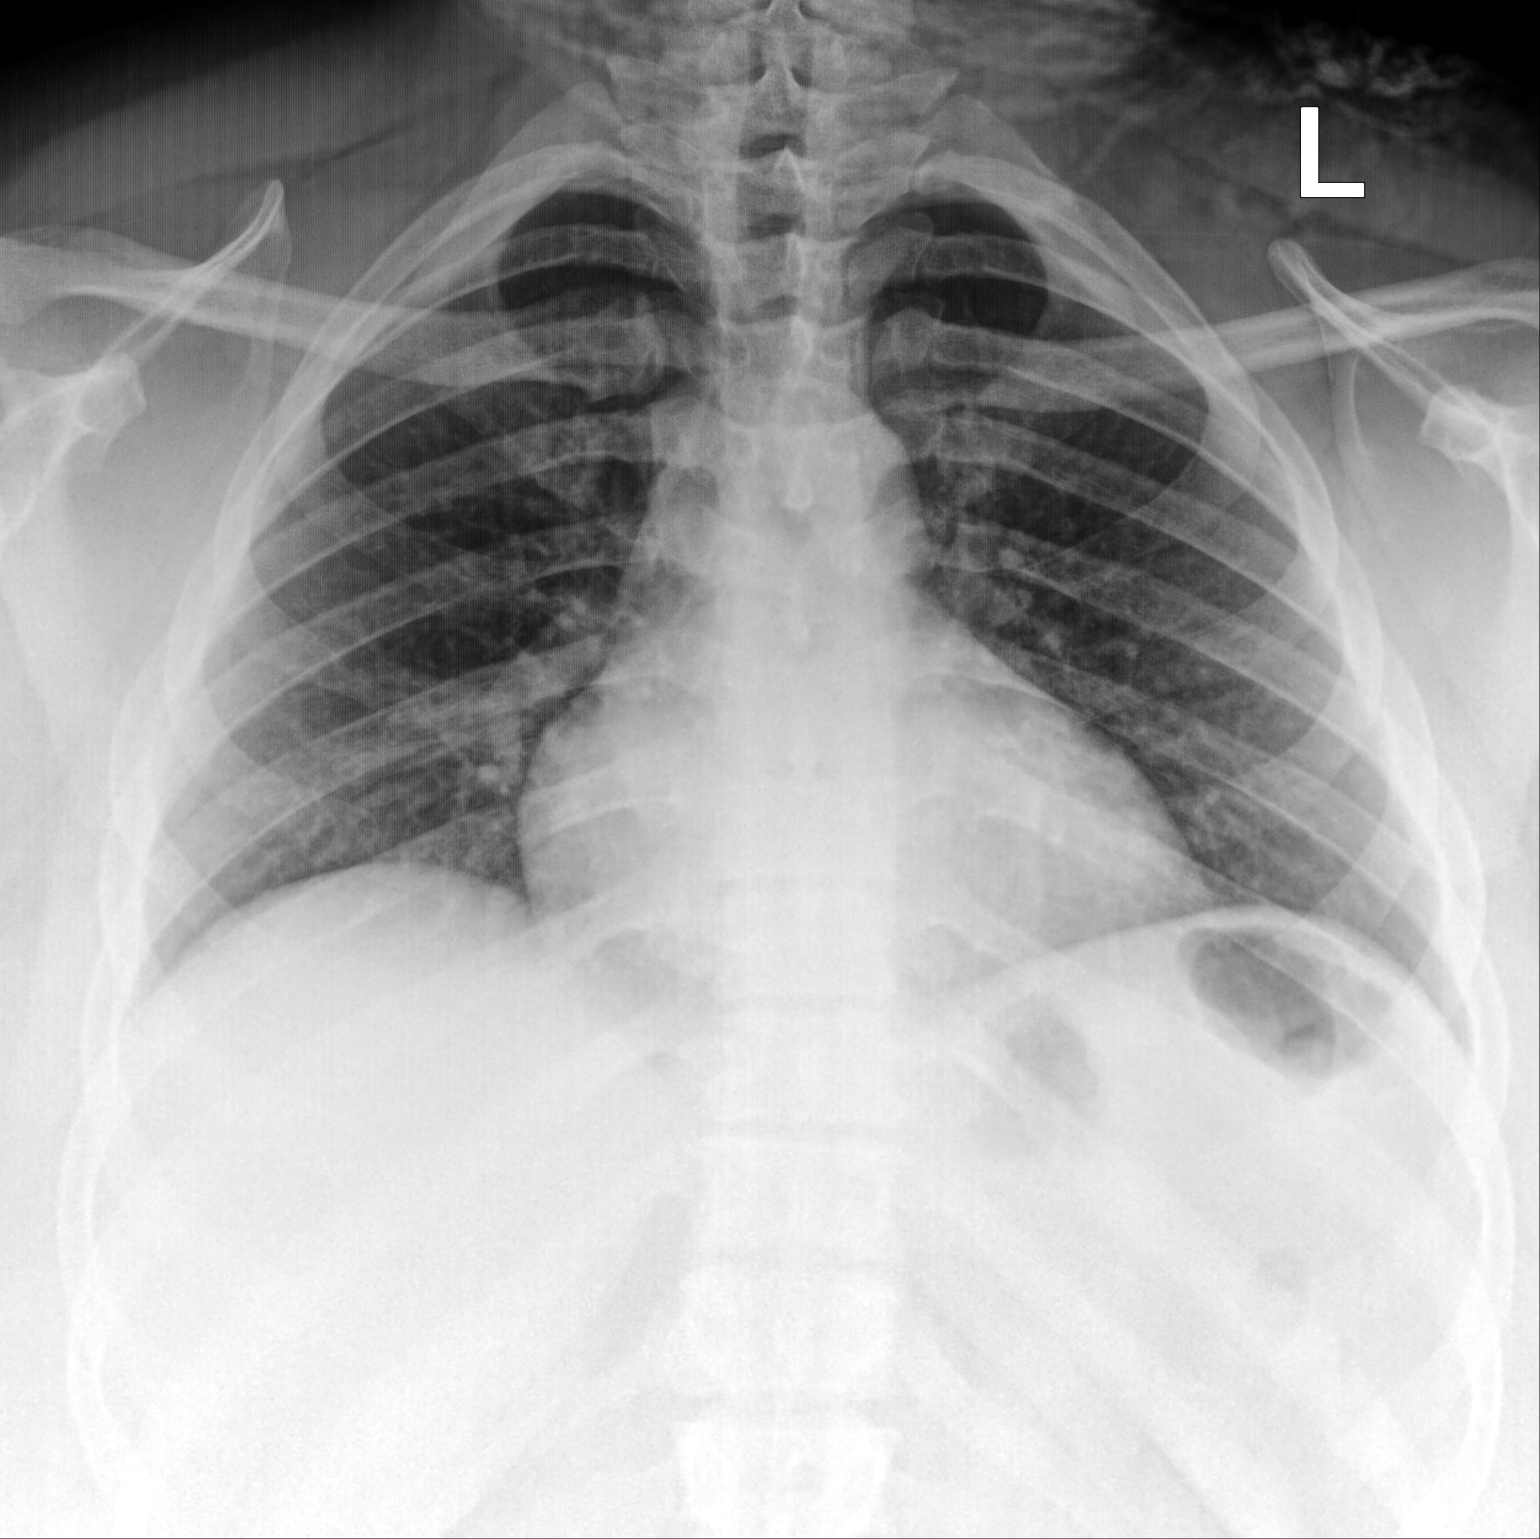

[chest lat]
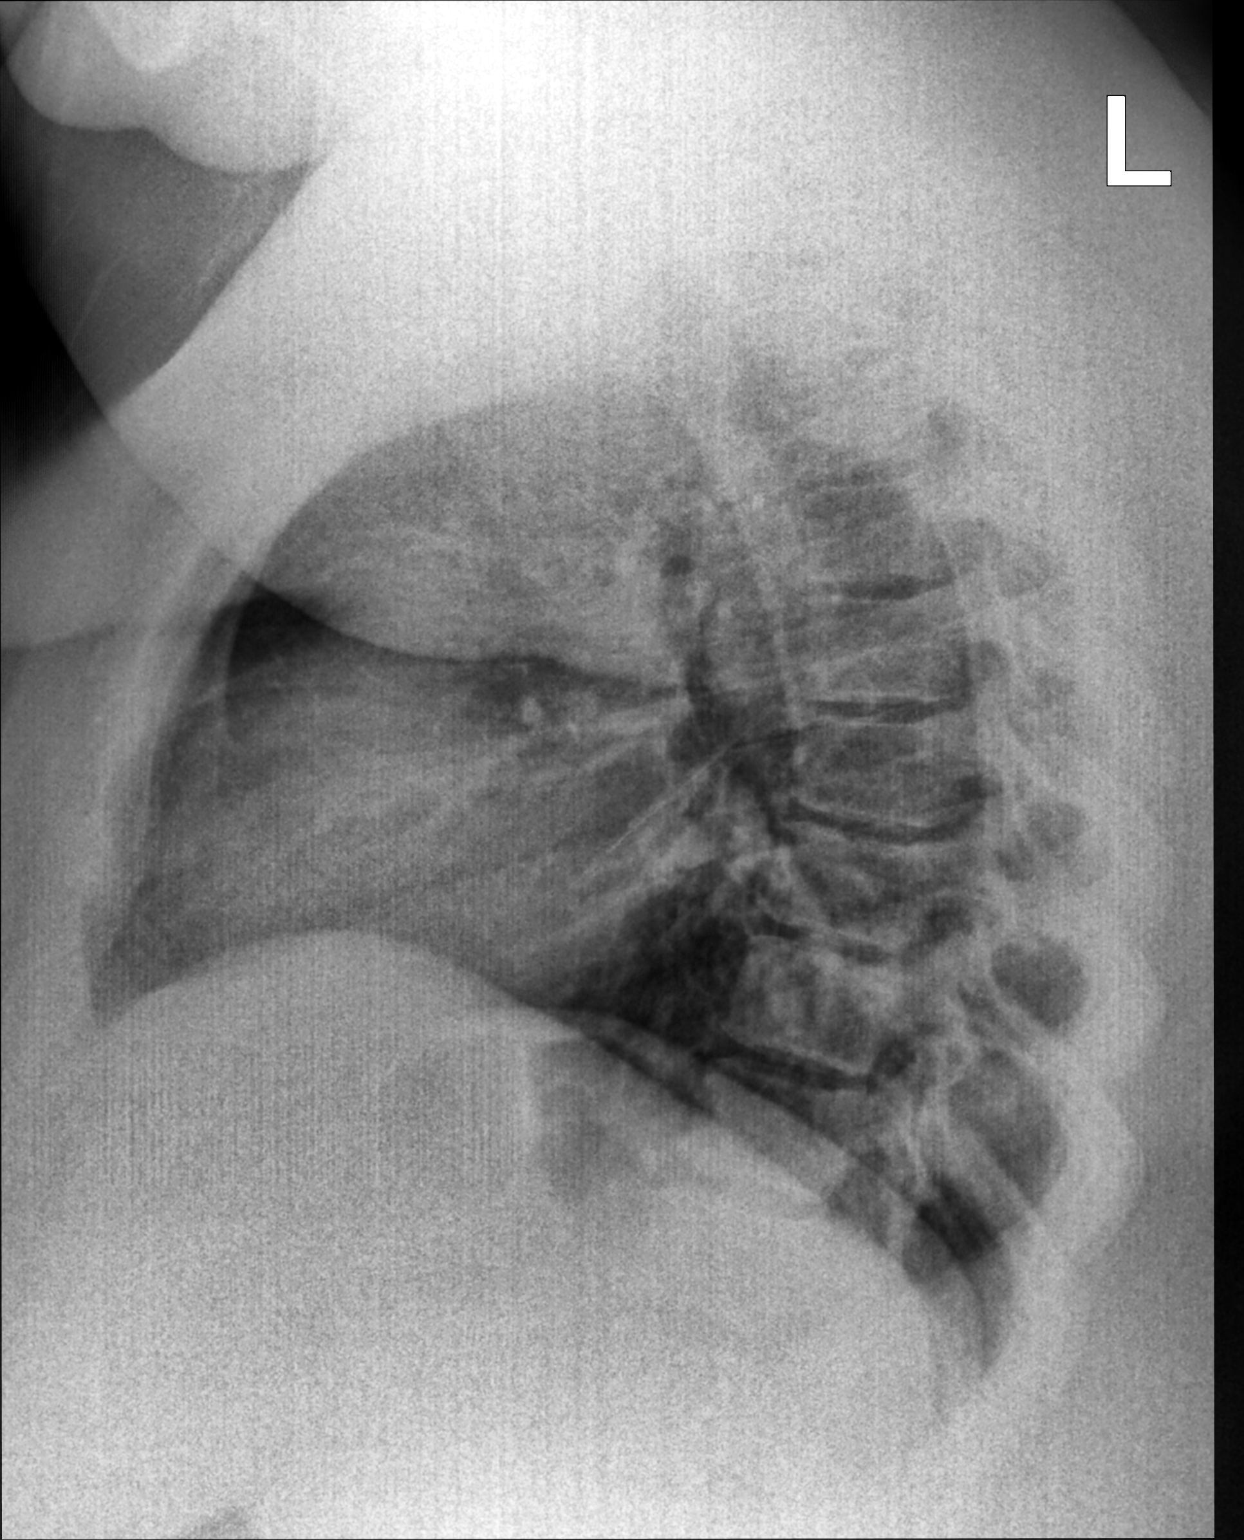

[2 of 2 positions shown; findings below may reference images not displayed]

FINDINGS: Stable cardiomediastinal contours. Low lung volumes. Lungs are
clear. No pneumothorax or pleural effusion. No acute finding in the
visualized skeleton.
IMPRESSION: No acute cardiopulmonary finding.  No evidence of hilar adenopathy.

## 2021-08-13 ENCOUNTER — Other Ambulatory Visit: Payer: Self-pay | Admitting: Family Medicine

## 2021-08-13 DIAGNOSIS — R635 Abnormal weight gain: Secondary | ICD-10-CM

## 2021-09-07 ENCOUNTER — Other Ambulatory Visit: Payer: Self-pay | Admitting: Family Medicine

## 2021-09-07 DIAGNOSIS — L709 Acne, unspecified: Secondary | ICD-10-CM

## 2021-09-08 MED ORDER — CLINDAMYCIN PHOS-BENZOYL PEROX 1-5 % EX GEL: CUTANEOUS | 0 refills | Status: DC

## 2021-10-03 ENCOUNTER — Encounter: Payer: Commercial Managed Care - PPO | Admitting: Certified Nurse Midwife

## 2021-10-04 ENCOUNTER — Ambulatory Visit (INDEPENDENT_AMBULATORY_CARE_PROVIDER_SITE_OTHER): Payer: 59 | Admitting: Certified Nurse Midwife

## 2021-10-04 ENCOUNTER — Other Ambulatory Visit: Payer: Self-pay

## 2021-10-04 ENCOUNTER — Other Ambulatory Visit (HOSPITAL_COMMUNITY)
Admission: RE | Admit: 2021-10-04 | Discharge: 2021-10-04 | Disposition: A | Discharge status: Home / Self Care | Payer: 59 | Source: Ambulatory Visit | Attending: Certified Nurse Midwife | Admitting: Certified Nurse Midwife

## 2021-10-04 ENCOUNTER — Encounter: Payer: Self-pay | Admitting: Certified Nurse Midwife

## 2021-10-04 VITALS — BP 140/87 | HR 88 | Ht 70.0 in | Wt 375.5 lb

## 2021-10-04 DIAGNOSIS — Z124 Encounter for screening for malignant neoplasm of cervix: Secondary | ICD-10-CM

## 2021-10-04 DIAGNOSIS — E669 Obesity, unspecified: Secondary | ICD-10-CM

## 2021-10-04 DIAGNOSIS — Z113 Encounter for screening for infections with a predominantly sexual mode of transmission: Secondary | ICD-10-CM

## 2021-10-04 DIAGNOSIS — Z01419 Encounter for gynecological examination (general) (routine) without abnormal findings: Secondary | ICD-10-CM | POA: Insufficient documentation

## 2021-10-04 NOTE — Patient Instructions (Signed)
Preventive Care 21-33 Years Old, Female Preventive care refers to lifestyle choices and visits with your health care provider that can promote health and wellness. This includes: A yearly physical exam. This is also called an annual wellness visit. Regular dental and eye exams. Immunizations. Screening for certain conditions. Healthy lifestyle choices, such as: Eating a healthy diet. Getting regular exercise. Not using drugs or products that contain nicotine and tobacco. Limiting alcohol use. What can I expect for my preventive care visit? Physical exam Your health care provider may check your: Height and weight. These may be used to calculate your BMI (body mass index). BMI is a measurement that tells if you are at a healthy weight. Heart rate and blood pressure. Body temperature. Skin for abnormal spots. Counseling Your health care provider may ask you questions about your: Past medical problems. Family's medical history. Alcohol, tobacco, and drug use. Emotional well-being. Home life and relationship well-being. Sexual activity. Diet, exercise, and sleep habits. Work and work environment. Access to firearms. Method of birth control. Menstrual cycle. Pregnancy history. What immunizations do I need? Vaccines are usually given at various ages, according to a schedule. Your health care provider will recommend vaccines for you based on your age, medical history, and lifestyle or other factors, such as travel or where you work. What tests do I need? Blood tests Lipid and cholesterol levels. These may be checked every 5 years starting at age 20. Hepatitis C test. Hepatitis B test. Screening Diabetes screening. This is done by checking your blood sugar (glucose) after you have not eaten for a while (fasting). STD (sexually transmitted disease) testing, if you are at risk. BRCA-related cancer screening. This may be done if you have a family history of breast, ovarian, tubal, or  peritoneal cancers. Pelvic exam and Pap test. This may be done every 3 years starting at age 21. Starting at age 30, this may be done every 5 years if you have a Pap test in combination with an HPV test. Talk with your health care provider about your test results, treatment options, and if necessary, the need for more tests. Follow these instructions at home: Eating and drinking  Eat a healthy diet that includes fresh fruits and vegetables, whole grains, lean protein, and low-fat dairy products. Take vitamin and mineral supplements as recommended by your health care provider. Do not drink alcohol if: Your health care provider tells you not to drink. You are pregnant, may be pregnant, or are planning to become pregnant. If you drink alcohol: Limit how much you have to 0-1 drink a day. Be aware of how much alcohol is in your drink. In the [place] one drink equals one 12 oz bottle of beer (355 mL), one 5 oz glass of wine (148 mL), or one 1 oz glass of hard liquor (44 mL). Lifestyle Take daily care of your teeth and gums. Brush your teeth every morning and night with fluoride toothpaste. Floss one time each day. Stay active. Exercise for at least 30 minutes 5 or more days each week. Do not use any products that contain nicotine or tobacco, such as cigarettes, e-cigarettes, and chewing tobacco. If you need help quitting, ask your health care provider. Do not use drugs. If you are sexually active, practice safe sex. Use a condom or other form of protection to prevent STIs (sexually transmitted infections). If you do not wish to become pregnant, use a form of birth control. If you plan to become pregnant, see your health care provider   for a prepregnancy visit. Find healthy ways to cope with stress, such as: Meditation, yoga, or listening to music. Journaling. Talking to a trusted person. Spending time with friends and family. Safety Always wear your seat belt while driving or riding in a  vehicle. Do not drive: If you have been drinking alcohol. Do not ride with someone who has been drinking. When you are tired or distracted. While texting. Wear a helmet and other protective equipment during sports activities. If you have firearms in your house, make sure you follow all gun safety procedures. Seek help if you have been physically or sexually abused. What's next? Go to your health care provider once a year for an annual wellness visit. Ask your health care provider how often you should have your eyes and teeth checked. Stay up to date on all vaccines. This information is not intended to replace advice given to you by your health care provider. Make sure you discuss any questions you have with your health care provider. Document Revised: 01/27/2021 Document Reviewed: 07/31/2018 Elsevier Patient Education  2022 Elsevier Inc.  

## 2021-10-04 NOTE — Progress Notes (Signed)
GYNECOLOGY ANNUAL PREVENTATIVE CARE ENCOUNTER NOTE  History:     Christina Bowen is a 35 y.o. G0P0000 female here for a routine annual gynecologic exam.  Current complaints: sometimes has 2 cycles a month.   Denies abnormal vaginal bleeding, discharge, pelvic pain, problems with intercourse or other gynecologic concerns.     Social Relationship: married  Living: with spouse Work: [employer] Exercise: 3-4 times wk for 30-1hr Smoke/Alcohol/drug use: occasional alcohol use, denies drugs and smoking  Gynecologic History Patient's last menstrual period was 09/13/2021 (exact date). Contraception: condoms Last Pap: 05/30/20. Results were: normal with positive HPV Last mammogram: n/a .    Upstream - 10/04/21 0919       Pregnancy Intention Screening   Does the patient want to become pregnant in the next year? Unsure    Does the patient's partner want to become pregnant in the next year? Unsure    Would the patient like to discuss contraceptive options today? No            The pregnancy intention screening data noted above was reviewed. Potential methods of contraception were discussed. The patient elected to proceed with condoms.   Obstetric History OB History  Gravida Para Term Preterm AB Living  0 0 0 0 0 0  SAB IAB Ectopic Multiple Live Births  0 0 0 0      Past Medical History:  Diagnosis Date   HPV (human papilloma virus) anogenital infection    LGSIL (low grade squamous intraepithelial dysplasia)    Vitamin D deficiency disease     History reviewed. No pertinent surgical history.  Current Outpatient Medications on File Prior to Visit  Medication Sig Dispense Refill   clindamycin-benzoyl peroxide (BENZACLIN) gel APPLY TO AFFECTED AREA EVERY DAY AS NEEDED 25 g 0   cyanocobalamin (,VITAMIN B-12,) 1000 MCG/ML injection INJECT 1 ML INTO THE MUSCLE EVERY 30 DAYS. 3 mL 5   metroNIDAZOLE (FLAGYL) 500 MG tablet TAKE 1 TABLET(500 MG) BY MOUTH TWICE DAILY  14 tablet 3   Vitamin D, Ergocalciferol, (DRISDOL) 1.25 MG (50000 UNIT) CAPS capsule Take 1 capsule (50,000 Units total) by mouth every 7 (seven) days. 12 capsule 5   metroNIDAZOLE (METROGEL) 0.75 % vaginal gel Place 1 Applicatorful vaginally at bedtime. Apply one applicatorful to vagina at bedtime for 5 days (Patient not taking: Reported on 10/04/2021) 70 g 2   phentermine (ADIPEX-P) 37.5 MG tablet Take 1 tablet (37.5 mg total) by mouth daily before breakfast. (Patient not taking: Reported on 10/04/2021) 30 tablet 0   triamcinolone cream (KENALOG) 0.1 % Apply topically 2 (two) times daily. 45 g 6   No current facility-administered medications on file prior to visit.    No Known Allergies  Social History:  reports that she has quit smoking. She has never used smokeless tobacco. She reports current alcohol use. She reports that she does not use drugs.  Family History  Problem Relation Age of Onset   Diabetes Father    Heart disease Maternal Grandmother     The following portions of the patient's history were reviewed and updated as appropriate: allergies, current medications, past family history, past medical history, past social history, past surgical history and problem list.  Review of Systems Pertinent items noted in HPI and remainder of comprehensive ROS otherwise negative.  Physical Exam:  BP (!) 163/97   Pulse 88   Ht 5\' 10"  (1.778 m)   Wt (!) 375 lb 8 oz (170.3 kg)  LMP 09/13/2021 (Exact Date)   BMI 53.88 kg/m  CONSTITUTIONAL: Well-developed, well-nourished, morbidly obese, female in no acute distress.  HENT:  Normocephalic, atraumatic, External right and left ear normal. Oropharynx is clear and moist EYES: Conjunctivae and EOM are normal. Pupils are equal, round, and reactive to light. No scleral icterus.  NECK: Normal range of motion, supple, no masses.  Normal thyroid.  SKIN: Skin is warm and dry. No rash noted. Not diaphoretic. No erythema. No pallor. MUSCULOSKELETAL:  Normal range of motion. No tenderness.  No cyanosis, clubbing, or edema.  2+ distal pulses. NEUROLOGIC: Alert and oriented to person, place, and time. Normal reflexes, muscle tone coordination.  PSYCHIATRIC: Normal mood and affect. Normal behavior. Normal judgment and thought content. CARDIOVASCULAR: Normal heart rate noted, regular rhythm RESPIRATORY: Clear to auscultation bilaterally. Effort and breath sounds normal, no problems with respiration noted. BREASTS: Symmetric in size. No masses, tenderness, skin changes, nipple drainage, or lymphadenopathy bilaterally.  ABDOMEN: Soft, no distention noted.  No tenderness, rebound or guarding.  PELVIC: Normal appearing external genitalia and urethral meatus; normal appearing vaginal mucosa and cervix.  No abnormal discharge noted.  Pap smear obtained.  Uterine size difficult to assess due to body habitus, no other palpable masses, no uterine or adnexal tenderness.  .   Assessment and Plan:    1. Well woman exam with routine gynecological exam   Pap: Will follow up results of pap smear and manage accordingly. Mammogram : none Labs: STD testing, Hem A 1 c, Lipid, TSH, vit D, Vit B 12  Refills: none  Referral: none  BP elevated today, repeat 147/82. Pt encouraged to monitor and notify PCP if consitnantly 140 or higher 90 or higher. She verbalizes and agrees.  Routine preventative health maintenance measures emphasized. Please refer to After Visit Summary for other counseling recommendations.      Philip Aspen, CNM [facility]

## 2021-10-05 LAB — THYROID PANEL WITH TSH
Free Thyroxine Index: 1.7 (ref 1.2–4.9)
T3 Uptake Ratio: 28 % (ref 24–39)
T4, Total: 6.1 ug/dL (ref 4.5–12.0)
TSH: 1.06 u[IU]/mL (ref 0.450–4.500)

## 2021-10-05 LAB — LIPID PANEL
Chol/HDL Ratio: 3.9 ratio (ref 0.0–4.4)
Cholesterol, Total: 171 mg/dL (ref 100–199)
HDL: 44 mg/dL (ref >39)
LDL Chol Calc (NIH): 105 mg/dL — ABNORMAL HIGH (ref 0–99)
Triglycerides: 125 mg/dL (ref 0–149)
VLDL Cholesterol Cal: 22 mg/dL (ref 5–40)

## 2021-10-05 LAB — HEPATITIS C ANTIBODY: Hep C Virus Ab: <0.1 s/co ratio (ref 0.0–0.9)

## 2021-10-05 LAB — VITAMIN D 25 HYDROXY (VIT D DEFICIENCY, FRACTURES): Vit D, 25-Hydroxy: 23 ng/mL — ABNORMAL LOW (ref 30.0–100.0)

## 2021-10-05 LAB — HEPATITIS B SURFACE ANTIGEN: Hepatitis B Surface Ag: NEGATIVE

## 2021-10-05 LAB — RPR: RPR Ser Ql: NONREACTIVE

## 2021-10-05 LAB — HSV(HERPES SIMPLEX VRS) I + II AB-IGG
HSV 1 Glycoprotein G Ab, IgG: 19.7 index — ABNORMAL HIGH (ref 0.00–0.90)
HSV 2 IgG, Type Spec: 6.32 index — ABNORMAL HIGH (ref 0.00–0.90)

## 2021-10-05 LAB — VITAMIN B12: Vitamin B-12: 841 pg/mL (ref 232–1245)

## 2021-10-05 LAB — HIV ANTIBODY (ROUTINE TESTING W REFLEX): HIV Screen 4th Generation wRfx: NONREACTIVE

## 2021-10-05 LAB — HEMOGLOBIN A1C
Est. average glucose Bld gHb Est-mCnc: 103 mg/dL
Hgb A1c MFr Bld: 5.2 % (ref 4.8–5.6)

## 2021-10-09 ENCOUNTER — Encounter: Payer: Commercial Managed Care - PPO | Admitting: Certified Nurse Midwife

## 2021-10-10 LAB — CYTOLOGY - PAP
Chlamydia: NEGATIVE
Comment: NEGATIVE
Comment: NEGATIVE
Comment: NORMAL
Diagnosis: NEGATIVE
Neisseria Gonorrhea: NEGATIVE
Trichomonas: NEGATIVE

## 2021-10-13 ENCOUNTER — Ambulatory Visit: Payer: 59 | Admitting: Family Medicine

## 2021-10-13 ENCOUNTER — Encounter: Payer: Self-pay | Admitting: Family Medicine

## 2021-10-13 VITALS — BP 128/82 | HR 77 | Temp 98.4°F | Wt 372.4 lb

## 2021-10-13 DIAGNOSIS — Z712 Person consulting for explanation of examination or test findings: Secondary | ICD-10-CM

## 2021-10-13 DIAGNOSIS — L709 Acne, unspecified: Secondary | ICD-10-CM | POA: Diagnosis not present

## 2021-10-13 DIAGNOSIS — R0982 Postnasal drip: Secondary | ICD-10-CM

## 2021-10-13 MED ORDER — CLINDAMYCIN PHOSPHATE 1 % EX GEL: CUTANEOUS | 0 refills | Status: DC

## 2021-10-13 MED ORDER — FLUTICASONE PROPIONATE 50 MCG/ACT NA SUSP: 1.0000 | Freq: Every day | NASAL | 0 refills | Status: DC

## 2021-10-13 MED ORDER — BENZOYL PEROXIDE 2.5 % EX CREA: TOPICAL_CREAM | CUTANEOUS | Status: DC

## 2021-10-13 MED ORDER — CETIRIZINE HCL 10 MG PO TABS: 10.0000 mg | ORAL_TABLET | Freq: Every day | ORAL | 0 refills | Status: DC

## 2021-10-13 NOTE — Patient Instructions (Addendum)
[facility] -Crawford Givens, MD  -Waymon Amato, MD - Everett Graff, MD -Kennis Carina, MD ---Kendall Flack, MD is listed on their site, but I'm pretty sure she retired.  [facility] -[place], [facility] -Christophe Louis, MD  -Drema Dallas, DO  Dr. Edwinna Areola.  Gyn only.  Office at [employer] [facility].

## 2021-10-13 NOTE — Progress Notes (Signed)
Subjective:    Patient ID: Christina Bowen, female    DOB: Oct 05, 1986, 35 y.o.   MRN: [id number]  Chief Complaint  Patient presents with   Results    Discuss labs from GYN, possibly get referral to new OBGYN    HPI Patient was seen today to discuss lab results.  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  Pt also advised she would need colpo 2/2 HPV + pap.  Pt notes prior h/o abnormal paps.  Had Gardisil vaccine.  Pt inquires about dx as she has never had any sx or outbreaks.  States HSV testing was previously negative.  Pt is married.  Her husband had negative testing after her results.  Pt inquires about second opinion.  Patient states insurance no longer pain for BenzaClin gel for acne.  Patient states the creams were gel was working well.  Inquires about alternatives.  Pt endorses nasal drainage and congestion.  Was taking OTC cold/sinus medication without improvement in symptoms.  Past Medical History:  Diagnosis Date   HPV (human papilloma virus) anogenital infection    LGSIL (low grade squamous intraepithelial dysplasia)    Vitamin D deficiency disease     No Known Allergies  ROS General: Denies fever, chills, night sweats, changes in weight, changes in appetite HEENT: Denies headaches, ear pain, changes in vision, rhinorrhea, sore throat CV: Denies CP, palpitations, SOB, orthopnea Pulm: Denies SOB, cough, wheezing GI: Denies abdominal pain, nausea, vomiting, diarrhea, constipation GU: Denies dysuria, hematuria, frequency, vaginal discharge + abnormal Pap Msk: Denies muscle cramps, joint pains Neuro: Denies weakness, numbness, tingling Skin: Denies rashes, bruising Psych: Denies depression, anxiety, hallucinations     Objective:    Blood pressure 128/82, pulse 77, temperature 98.4 F (36.9 C), temperature source Oral, weight (!) 372 lb 6.4 oz (168.9 kg), last menstrual period 09/13/2021, SpO2 99 %.  Gen. Pleasant, well-nourished,  in no distress, normal affect   HEENT: [place]/AT, face symmetric, conjunctiva clear, no scleral icterus, PERRLA, EOMI, nares patent without drainage Lungs: no accessory muscle use Cardiovascular: RRR, no peripheral edema Musculoskeletal: No deformities, no cyanosis or clubbing, normal tone Neuro:  A&Ox3, CN II-XII intact, normal gait Skin:  Warm, no lesions/ rash   Wt Readings from Last 3 Encounters:  10/13/21 (!) 372 lb 6.4 oz (168.9 kg)  10/04/21 (!) 375 lb 8 oz (170.3 kg)  10/05/20 (!) 393 lb (178.3 kg)    Lab Results  Component Value Date   WBC 10.1 10/05/2020   HGB 14.1 10/05/2020   HCT 41.9 10/05/2020   PLT 453 (H) 10/05/2020   GLUCOSE 86 09/15/2020   CHOL 171 10/04/2021   TRIG 125 10/04/2021   HDL 44 10/04/2021   LDLCALC 105 (H) 10/04/2021   ALT 16 09/15/2020   AST 13 09/15/2020   NA 137 09/15/2020   K 4.3 09/15/2020   CL 106 09/15/2020   CREATININE 0.64 09/15/2020   BUN 13 09/15/2020   CO2 24 09/15/2020   TSH 1.060 10/04/2021   HGBA1C 5.2 10/04/2021    Assessment/Plan:  Encounter to discuss test results -HSV 1 and HSV-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  Patient asymptomatic. -Previous HSV 1 & 2 IgM testing negative. -Lab results reviewed and discussed with patient.  Questions answered to satisfaction. -per chart review unclear if HPV testing obtained on pap from 10/04/21.   -Encourage to further discuss with OB/GYN  Acne, unspecified acne type  -Pt no longer covering BenzaClin gel -Will try  writing prescriptions separately to see if covered. - Plan: Benzoyl Peroxide 2.5 % CREA, clindamycin (CLINDAGEL) 1 % gel  Post-nasal drainage  - Plan: fluticasone (FLONASE) 50 MCG/ACT nasal spray, cetirizine (ZYRTEC) 10 MG tablet  F/u as needed  Grier Mitts, MD

## 2021-10-18 ENCOUNTER — Encounter: Payer: Self-pay | Admitting: Family Medicine

## 2021-10-19 ENCOUNTER — Encounter: Payer: Self-pay | Admitting: Family Medicine

## 2021-10-19 ENCOUNTER — Telehealth: Payer: 59 | Admitting: Family Medicine

## 2021-10-19 DIAGNOSIS — J018 Other acute sinusitis: Secondary | ICD-10-CM

## 2021-10-19 DIAGNOSIS — R051 Acute cough: Secondary | ICD-10-CM | POA: Diagnosis not present

## 2021-10-19 MED ORDER — BENZONATATE 100 MG PO CAPS: 100.0000 mg | ORAL_CAPSULE | Freq: Two times a day (BID) | ORAL | 0 refills | Status: DC | PRN

## 2021-10-19 MED ORDER — AMOXICILLIN-POT CLAVULANATE 500-125 MG PO TABS: 1.0000 | ORAL_TABLET | Freq: Two times a day (BID) | ORAL | 0 refills | Status: DC

## 2021-10-19 NOTE — Progress Notes (Signed)
Virtual Visit via Video Note  I connected with Christina Bowen on 10/19/21 at  4:00 PM EST by a video enabled telemedicine application 2/2 IWOEH-21 pandemic and verified that I am speaking with the correct person using two identifiers.  Location patient: home Location provider:work or home office Persons participating in the virtual visit: patient, provider  I discussed the limitations of evaluation and management by telemedicine and the availability of in person appointments. The patient expressed understanding and agreed to proceed.  Chief Complaint  Patient presents with   Nasal Congestion    Has worsened, has congestion in head, chest, nose. States it may be sinusitis.    HPI: Pt seen for acute concern states now having head and chest congestion.  Symptoms started several days prior to appt on 10/13/21.  Pt endorses continued head congestion, cough, facial pain/frontal pressure, HA, and sore throat.  Jaw/area in front of ear was sore, but pt attributed it to sleeping with her mouth open to breathe.  Denies fever, diarrhea, n/v.  Taking zyrtec, flonase, thera flu max.    ROS: See pertinent positives and negatives per HPI.  Past Medical History:  Diagnosis Date   HPV (human papilloma virus) anogenital infection    LGSIL (low grade squamous intraepithelial dysplasia)    Vitamin D deficiency disease     No past surgical history on file.  Family History  Problem Relation Age of Onset   Diabetes Father    Heart disease Maternal Grandmother     SOCIAL HX: pt is a Marine scientist.   Current Outpatient Medications:    Benzoyl Peroxide 2.5 % CREA, Apply a small amount to affected area daily as needed, Disp: 21 g, Rfl: G   cetirizine (ZYRTEC) 10 MG tablet, Take 1 tablet (10 mg total) by mouth daily., Disp: 30 tablet, Rfl: 0   clindamycin (CLINDAGEL) 1 % gel, Apply to affected area daily as needed., Disp: 30 g, Rfl: 0   cyanocobalamin (,VITAMIN B-12,) 1000 MCG/ML injection, INJECT 1 ML INTO THE  MUSCLE EVERY 30 DAYS., Disp: 3 mL, Rfl: 5   fluticasone (FLONASE) 50 MCG/ACT nasal spray, Place 1 spray into both nostrils daily., Disp: 16 g, Rfl: 0   metroNIDAZOLE (FLAGYL) 500 MG tablet, TAKE 1 TABLET(500 MG) BY MOUTH TWICE DAILY, Disp: 14 tablet, Rfl: 3   Vitamin D, Ergocalciferol, (DRISDOL) 1.25 MG (50000 UNIT) CAPS capsule, Take 1 capsule (50,000 Units total) by mouth every 7 (seven) days., Disp: 12 capsule, Rfl: 5   clindamycin-benzoyl peroxide (BENZACLIN) gel, APPLY TO AFFECTED AREA EVERY DAY AS NEEDED, Disp: 25 g, Rfl: 0  EXAM:  VITALS per patient if applicable: RR between 22-48 bpm  GENERAL: alert, oriented, appears well and in no acute distress  HEENT: atraumatic, conjunctiva clear, no obvious abnormalities on inspection of external nose and ears  NECK: normal movements of the head and neck  LUNGS: on inspection no signs of respiratory distress, breathing rate appears normal, no obvious gross SOB, gasping or wheezing  CV: no obvious cyanosis  MS: moves all visible extremities without noticeable abnormality  PSYCH/NEURO: pleasant and cooperative, no obvious depression or anxiety, speech and thought processing grossly intact  ASSESSMENT AND PLAN:  Discussed the following assessment and plan:  Other acute sinusitis, recurrence not specified  -Continue symptoms x1 week plus. -Continue supportive care including OTC meds, steam from shower, gargling with warm salt water or Chloraseptic spray -Start Augmentin -Given precautions - Plan: benzonatate (TESSALON) 100 MG capsule, amoxicillin-clavulanate (AUGMENTIN) 500-125 MG tablet  Acute cough -  Plan: Tessalon  Follow up as needed   I discussed the assessment and treatment plan with the patient. The patient was provided an opportunity to ask questions and all were answered. The patient agreed with the plan and demonstrated an understanding of the instructions.   The patient was advised to call back or seek an in-person  evaluation if the symptoms worsen or if the condition fails to improve as anticipated.  Billie Ruddy, MD

## 2021-10-24 ENCOUNTER — Other Ambulatory Visit: Payer: Self-pay

## 2021-10-24 ENCOUNTER — Other Ambulatory Visit (HOSPITAL_COMMUNITY)
Admission: RE | Admit: 2021-10-24 | Discharge: 2021-10-24 | Disposition: A | Discharge status: Home / Self Care | Payer: 59 | Source: Ambulatory Visit | Attending: Obstetrics & Gynecology | Admitting: Obstetrics & Gynecology

## 2021-10-24 ENCOUNTER — Ambulatory Visit (HOSPITAL_BASED_OUTPATIENT_CLINIC_OR_DEPARTMENT_OTHER): Payer: 59 | Admitting: Obstetrics & Gynecology

## 2021-10-24 ENCOUNTER — Encounter (HOSPITAL_BASED_OUTPATIENT_CLINIC_OR_DEPARTMENT_OTHER): Payer: Self-pay | Admitting: Obstetrics & Gynecology

## 2021-10-24 VITALS — BP 120/74 | HR 90 | Ht 70.0 in | Wt 375.6 lb

## 2021-10-24 DIAGNOSIS — R0982 Postnasal drip: Secondary | ICD-10-CM

## 2021-10-24 DIAGNOSIS — E559 Vitamin D deficiency, unspecified: Secondary | ICD-10-CM | POA: Diagnosis not present

## 2021-10-24 DIAGNOSIS — R768 Other specified abnormal immunological findings in serum: Secondary | ICD-10-CM | POA: Diagnosis not present

## 2021-10-24 DIAGNOSIS — R87810 Cervical high risk human papillomavirus (HPV) DNA test positive: Secondary | ICD-10-CM

## 2021-10-24 MED ORDER — VITAMIN D (ERGOCALCIFEROL) 1.25 MG (50000 UNIT) PO CAPS: ORAL_CAPSULE | ORAL | 3 refills | Status: DC

## 2021-10-25 DIAGNOSIS — R768 Other specified abnormal immunological findings in serum: Secondary | ICD-10-CM | POA: Insufficient documentation

## 2021-10-25 DIAGNOSIS — R87810 Cervical high risk human papillomavirus (HPV) DNA test positive: Secondary | ICD-10-CM | POA: Insufficient documentation

## 2021-10-25 NOTE — Progress Notes (Signed)
35 y.o. [place] Mar[place] American female here to establish care and discuss recent abnormal pap smears/recommendations.  Pt has been married for two years.  Husband is transplant recipient.  Pt has hx +HR HPV in the past.  Has had normal pap smears.  Pap smears can be reviewed back to 2016 and are as follows: 10/2015 normal pap with neg HR HPV 03/2016 normal pap 04/2017 normal pap 05/2018 normal pap  05/2019 normal pap, positive HR HPV, neg 16/18/45 05/2020 normal pap, positive HR HPV, neg 16/18/45 10/2021 normal pap  HR HPV testing and pap guidelines reviewed.  Pt was advised she had positive HR HPV testing this year but I do not see this anywhere in her chart.  She and I looked at her lab results together as well.  Advised she needs HR HPV testing today.  If positive, then will need colposcopy but given this has not been done yet, this is my recommendation.  Pt also had + HSV testing for HSV 1 and 2 IgGs.  She's had testing in the past with IgM.  Pt has questions about this as spouse tested negative and has never had an outbreak.  Difference in IgM and IgG antibody testing (prior vs acute infection) discussed.  Pt understands the difference and understands IgG antibodies could have been positive in the past as well but was not tested.  As pt has not had any prior symptoms, nor has spouse, would not recommend suppressive therapy or any other treatment at this time.  Also, rates of positive antibody testing in general population also discussed.  Lastly, has questions about Vit D level.  Reviewed lab work from earlier this month.  On 50K Vit D weekly but level is 23.  Discussed increasing Vit D to twice weekly and rechecking level in 3-6 months.  Pt comfortable with plan.  Patient's last menstrual period was 10/11/2021 (approximate).          Sexually active: Yes.    Smoker:  former   reports that she has quit smoking. She has never used smokeless tobacco. She reports that she does not  currently use alcohol. She reports that she does not use drugs.  Past Medical History:  Diagnosis Date   HPV (human papilloma virus) anogenital infection    LGSIL (low grade squamous intraepithelial dysplasia)    Vitamin D deficiency disease     History reviewed. No pertinent surgical history.  Current Outpatient Medications  Medication Sig Dispense Refill   cetirizine (ZYRTEC) 10 MG tablet Take 1 tablet (10 mg total) by mouth daily. 30 tablet 0   clindamycin (CLINDAGEL) 1 % gel Apply to affected area daily as needed. 30 g 0   cyanocobalamin (,VITAMIN B-12,) 1000 MCG/ML injection INJECT 1 ML INTO THE MUSCLE EVERY 30 DAYS. 3 mL 5   fluticasone (FLONASE) 50 MCG/ACT nasal spray Place 1 spray into both nostrils daily. 16 g 0   Vitamin D, Ergocalciferol, (DRISDOL) 1.25 MG (50000 UNIT) CAPS capsule Take 1 capsules orally twice weekly 24 capsule 3   No current facility-administered medications for this visit.    Family History  Problem Relation Age of Onset   Diabetes Father    Heart disease Mother    Diabetes Sister     Review of Systems  Constitutional: Negative.   Genitourinary: Negative.    Exam:   BP 120/74   Pulse 90   Ht 5\' 10"  (1.778 m)   Wt (!) 375 lb 9.6 oz (170.4 kg)  LMP 10/11/2021 (Approximate)   BMI 53.89 kg/m   Height: 5\' 10"  (177.8 cm)  Physical Exam Exam conducted with a chaperone present.  Constitutional:      Appearance: Normal appearance.  Genitourinary:    General: Normal vulva.     Labia:        Right: No rash or lesion.        Left: No rash or lesion.      Vagina: Normal.     Cervix: Normal.     Comments: Cytology for HR HPV obtained Lymphadenopathy:     Lower Body: No right inguinal adenopathy. No left inguinal adenopathy.  Skin:    General: Skin is warm.  Neurological:     General: No focal deficit present.     Mental Status: She is alert.  Psychiatric:        Mood and Affect: Mood normal.    Chaperone, Ezekiel Ina, RN, was present for  exam.  Assessment/Plan: 1. Cervical high risk HPV (human papillomavirus) test positive - Cervicovaginal ancillary only( [place]) - if positive, will proceed with colposcopy  2. HSV-2 seropositive - do not recommend suppressive therapy at this time - would also recommend not retesting for HSV unless symptomatic and diagnosis is not clear  3. Vitamin D deficiency - Vitamin D, Ergocalciferol, (DRISDOL) 1.25 MG (50000 UNIT) CAPS capsule; Take 1 capsules orally twice weekly  Dispense: 24 capsule; Refill: 3  - plan to recheck 3-6 months  Total time with pt:  35 minutes

## 2021-10-30 LAB — CERVICOVAGINAL ANCILLARY ONLY
Comment: NEGATIVE
Comment: NEGATIVE
HPV 16: NEGATIVE
HPV 18 / 45: NEGATIVE
High risk HPV: POSITIVE — AB

## 2021-11-03 ENCOUNTER — Encounter (HOSPITAL_BASED_OUTPATIENT_CLINIC_OR_DEPARTMENT_OTHER): Payer: 59 | Admitting: Obstetrics & Gynecology

## 2021-11-05 ENCOUNTER — Other Ambulatory Visit: Payer: Self-pay | Admitting: Family Medicine

## 2021-11-05 DIAGNOSIS — R0982 Postnasal drip: Secondary | ICD-10-CM

## 2021-11-08 DIAGNOSIS — M25572 Pain in left ankle and joints of left foot: Secondary | ICD-10-CM | POA: Diagnosis not present

## 2021-11-10 ENCOUNTER — Other Ambulatory Visit: Payer: Self-pay | Admitting: Family Medicine

## 2021-11-10 DIAGNOSIS — R0982 Postnasal drip: Secondary | ICD-10-CM

## 2021-11-13 ENCOUNTER — Encounter (HOSPITAL_BASED_OUTPATIENT_CLINIC_OR_DEPARTMENT_OTHER): Payer: Self-pay | Admitting: Obstetrics & Gynecology

## 2021-11-13 ENCOUNTER — Other Ambulatory Visit (HOSPITAL_COMMUNITY)
Admission: RE | Admit: 2021-11-13 | Discharge: 2021-11-13 | Disposition: A | Discharge status: Home / Self Care | Payer: 59 | Source: Ambulatory Visit | Attending: Obstetrics & Gynecology | Admitting: Obstetrics & Gynecology

## 2021-11-13 ENCOUNTER — Other Ambulatory Visit: Payer: Self-pay

## 2021-11-13 ENCOUNTER — Ambulatory Visit (INDEPENDENT_AMBULATORY_CARE_PROVIDER_SITE_OTHER): Payer: 59 | Admitting: Obstetrics & Gynecology

## 2021-11-13 VITALS — BP 101/46 | HR 84 | Ht 70.0 in | Wt 373.2 lb

## 2021-11-13 DIAGNOSIS — Z3202 Encounter for pregnancy test, result negative: Secondary | ICD-10-CM | POA: Diagnosis not present

## 2021-11-13 DIAGNOSIS — B977 Papillomavirus as the cause of diseases classified elsewhere: Secondary | ICD-10-CM

## 2021-11-13 DIAGNOSIS — R87810 Cervical high risk human papillomavirus (HPV) DNA test positive: Secondary | ICD-10-CM

## 2021-11-13 DIAGNOSIS — D260 Other benign neoplasm of cervix uteri: Secondary | ICD-10-CM | POA: Diagnosis not present

## 2021-11-13 DIAGNOSIS — Z01812 Encounter for preprocedural laboratory examination: Secondary | ICD-10-CM

## 2021-11-13 DIAGNOSIS — N72 Inflammatory disease of cervix uteri: Secondary | ICD-10-CM | POA: Diagnosis not present

## 2021-11-13 LAB — POCT URINE PREGNANCY: Preg Test, Ur: NEGATIVE

## 2021-11-13 NOTE — Progress Notes (Signed)
35 y.o. [place] Mar[place] female here for colposcopy with possible biopsies and/or ECC due to +HR HPV obtained  Pap obtained 10/24/2021.  Pt had normal pap obtained 10/04/2021 by different provider.  She ad +HR HPV on pap 1 year ago as well.  Pap was normal then.  Results discussed.  Recommendations reviewed.  Consent obtained.  Prior evaluation/treatment:  none.  No LMP recorded.          Sexually active: Yes.    The current method of family planning is none.     Patient has been counseled about results and procedure.  Risks and benefits have bene reviewed including immediate and/or delayed bleeding, infection, cervical scaring from procedure, possibility of needing additional follow up as well as treatment.  Rare risks of missing a lesion discussed as well.  All questions answered.  Pt ready to proceed.  Consent obtained.  BP (!) 101/46   Pulse 84   Ht 5\' 10"  (1.778 m)   Wt (!) 373 lb 3.2 oz (169.3 kg)   BMI 53.55 kg/m   General appearance: alert, cooperative and appears stated age Lymph nodes: No abnormal inguinal nodes palpated Neurologic: Grossly normal  Pelvic: External genitalia:  no lesions              Urethra:  normal appearing urethra with no masses, tenderness or lesions              Bartholins and Skenes: normal                 Vagina: normal appearing vagina with normal color and no discharge, no lesions               Physical Exam Constitutional:      Appearance: Normal appearance.  Genitourinary:    General: Normal vulva.  Lymphadenopathy:     Lower Body: No right inguinal adenopathy. No left inguinal adenopathy.  Neurological:     General: No focal deficit present.  Psychiatric:        Mood and Affect: Mood normal.    Speculum placed.  3% acetic acid applied to cervix for >45 seconds.  Cervix visualized with both 7.5X and 15X magnification.  Green filter also used.  Lugols solution was used.  Findings:  small area of HPV related changes at 3 o'clock but no AWE  or decreased staining with Lugol's noted.  Biopsy:  none were obtained.  ECC:  was performed.  Monsel's was not needed.  Excellent hemostasis was present.  Pt tolerated procedure well and all instruments were removed.    Chaperone, Ezekiel Ina, RN, was present during procedure.  Assessment/Plan: 1. High risk HPV infection - Surgical pathology( / [facility]) - Pathology results will be called to patient and follow-up planned pending results.  2. Pre-procedure lab exam - POCT urine pregnancy was negative

## 2021-11-15 LAB — SURGICAL PATHOLOGY

## 2021-11-19 ENCOUNTER — Other Ambulatory Visit: Payer: Self-pay | Admitting: Family Medicine

## 2021-11-19 DIAGNOSIS — R0982 Postnasal drip: Secondary | ICD-10-CM

## 2021-12-13 ENCOUNTER — Encounter (HOSPITAL_BASED_OUTPATIENT_CLINIC_OR_DEPARTMENT_OTHER): Payer: 59 | Admitting: Obstetrics & Gynecology

## 2022-01-03 DIAGNOSIS — M25572 Pain in left ankle and joints of left foot: Secondary | ICD-10-CM | POA: Diagnosis not present

## 2022-02-26 ENCOUNTER — Encounter: Payer: Self-pay | Admitting: Family Medicine

## 2022-02-27 ENCOUNTER — Telehealth (INDEPENDENT_AMBULATORY_CARE_PROVIDER_SITE_OTHER): Payer: 59 | Admitting: Family Medicine

## 2022-02-27 ENCOUNTER — Encounter: Payer: Self-pay | Admitting: Family Medicine

## 2022-02-27 VITALS — Wt 370.0 lb

## 2022-02-27 DIAGNOSIS — R0981 Nasal congestion: Secondary | ICD-10-CM

## 2022-02-27 DIAGNOSIS — R519 Headache, unspecified: Secondary | ICD-10-CM

## 2022-02-27 MED ORDER — AMOXICILLIN-POT CLAVULANATE 875-125 MG PO TABS: 1.0000 | ORAL_TABLET | Freq: Two times a day (BID) | ORAL | 0 refills | Status: DC

## 2022-02-27 NOTE — Patient Instructions (Signed)
-  I sent the medication(s) we discussed to your pharmacy: ?Meds ordered this encounter  ?Medications  ? amoxicillin-clavulanate (AUGMENTIN) 875-125 MG tablet  ?  Sig: Take 1 tablet by mouth 2 (two) times daily.  ?  Dispense:  20 tablet  ?  Refill:  0  ? ?Continue your medications for your allergies ? ?Can try nasal saline sinus rinses twice daily ? ?I hope you are feeling better soon! ? ?Seek in person care promptly if your symptoms worsen, new concerns arise or you are not improving with treatment. ? ?It was nice to meet you today. I help [facility] out with telemedicine visits on Tuesdays and Thursdays and am happy to help if you need a virtual follow up visit on those days. Otherwise, if you have any concerns or questions following this visit please schedule a follow up visit with your Primary Care office or seek care at a local urgent care clinic to avoid delays in care ? ?

## 2022-02-27 NOTE — Progress Notes (Signed)
Virtual Visit via Video Note ? ?I connected with Christina Bowen ? on 02/27/22 at  3:00 PM EDT by a video enabled telemedicine application and verified that I am speaking with the correct person using two identifiers. ? Location patient:  ?Location provider:work or home office ?Persons participating in the virtual visit: patient, provider ? ?I discussed the limitations and requested verbal permission for telemedicine visit. The patient expressed understanding and agreed to proceed. ? ? ?HPI: ? ?Acute telemedicine visit for "sinusitis" : ?-Onset: 3 weeks ago but now worsening with sinus discomfort ?-Symptoms include: has some discolored nasal congestion, now developing sinus discomfort in the frontal region ?-Denies:fever, CP, SOB, NVD ?-Pertinent past medical history: see below, has had sinusitis in the past has year round bad allergies ?-is on flonase and zyrtec ?-Pertinent medication allergies:No Known Allergies ?-denies any chance of pregnancy ?-COVID-19 vaccine status:  ?Immunization History  ?Administered Date(s) Administered  ? Hpv-Unspecified 09/15/2021  ? Influenza-Unspecified 09/15/2021  ? Moderna Sars-Covid-2 Vaccination 12/18/2019, 01/18/2020, 02/25/2021  ? PPD Test 02/26/2021  ? Tdap 05/27/2019  ? ? ? ?ROS: See pertinent positives and negatives per HPI. ? ?Past Medical History:  ?Diagnosis Date  ? HPV (human papilloma virus) anogenital infection   ? LGSIL (low grade squamous intraepithelial dysplasia)   ? Vitamin D deficiency disease   ? ? ?History reviewed. No pertinent surgical history. ? ? ?Current Outpatient Medications:  ?  amoxicillin-clavulanate (AUGMENTIN) 875-125 MG tablet, Take 1 tablet by mouth 2 (two) times daily., Disp: 20 tablet, Rfl: 0 ?  cetirizine (ZYRTEC) 10 MG tablet, TAKE 1 TABLET BY MOUTH EVERY DAY, Disp: 30 tablet, Rfl: 0 ?  clindamycin (CLINDAGEL) 1 % gel, Apply to affected area daily as needed., Disp: 30 g, Rfl: 0 ?  cyanocobalamin (,VITAMIN B-12,) 1000 MCG/ML injection, INJECT 1 ML INTO  THE MUSCLE EVERY 30 DAYS., Disp: 3 mL, Rfl: 5 ?  fluticasone (FLONASE) 50 MCG/ACT nasal spray, SPRAY 1 SPRAY INTO BOTH NOSTRILS DAILY., Disp: 16 mL, Rfl: 0 ?  Vitamin D, Ergocalciferol, (DRISDOL) 1.25 MG (50000 UNIT) CAPS capsule, Take 1 capsules orally twice weekly, Disp: 24 capsule, Rfl: 3 ? ?EXAM: ? ?VITALS per patient if applicable: ? ?GENERAL: alert, oriented, appears well and in no acute distress ? ?HEENT: atraumatic, conjunttiva clear, no obvious abnormalities on inspection of external nose and ears, she points to frontal sinus region as area of discomfort ? ?NECK: normal movements of the head and neck ? ?LUNGS: on inspection no signs of respiratory distress, breathing rate appears normal, no obvious gross SOB, gasping or wheezing ? ?CV: no obvious cyanosis ? ?MS: moves all visible extremities without noticeable abnormality ? ?PSYCH/NEURO: pleasant and cooperative, no obvious depression or anxiety, speech and thought processing grossly intact ? ?ASSESSMENT AND PLAN: ? ?Discussed the following assessment and plan: ? ?Nasal congestion ? ?Facial pain ? ?-we discussed possible serious and likely etiologies, options for evaluation and workup, limitations of telemedicine visit vs in person visit, treatment, treatment risks and precautions. Pt is agreeable to treatment via telemedicine at this moment. She feels this has turned into a bacterial sinusitis, quite possible, vs other. She has opted to try nasal saline rinses, continue her allergy regimen and start Augmentin. We did discuss abx risks/appropriate use.  ? ?Advised to seek prompt virtual visit or in person care if worsening, new symptoms arise, or if is not improving with treatment as expected per our conversation of expected course. Discussed options for follow up care. Did let this patient know that I do  telemedicine on Tuesdays and Thursdays for [facility] and those are the days I am logged into the system.  ?  ?I discussed the assessment and treatment plan  with the patient. The patient was provided an opportunity to ask questions and all were answered. The patient agreed with the plan and demonstrated an understanding of the instructions. ?  ? ? ?Lucretia Kern, DO  ? ?

## 2022-05-18 ENCOUNTER — Other Ambulatory Visit: Payer: Self-pay | Admitting: Certified Nurse Midwife

## 2022-06-21 DIAGNOSIS — H5213 Myopia, bilateral: Secondary | ICD-10-CM | POA: Diagnosis not present

## 2022-06-21 DIAGNOSIS — H52223 Regular astigmatism, bilateral: Secondary | ICD-10-CM | POA: Diagnosis not present

## 2022-10-08 ENCOUNTER — Encounter: Payer: 59 | Admitting: Certified Nurse Midwife

## 2022-11-07 ENCOUNTER — Encounter (HOSPITAL_BASED_OUTPATIENT_CLINIC_OR_DEPARTMENT_OTHER): Payer: Self-pay | Admitting: Obstetrics & Gynecology

## 2022-11-07 ENCOUNTER — Ambulatory Visit (INDEPENDENT_AMBULATORY_CARE_PROVIDER_SITE_OTHER): Payer: 59 | Admitting: Obstetrics & Gynecology

## 2022-11-07 ENCOUNTER — Other Ambulatory Visit (HOSPITAL_COMMUNITY)
Admission: RE | Admit: 2022-11-07 | Discharge: 2022-11-07 | Disposition: A | Discharge status: Home / Self Care | Payer: 59 | Source: Ambulatory Visit | Attending: Obstetrics & Gynecology | Admitting: Obstetrics & Gynecology

## 2022-11-07 VITALS — BP 116/68 | HR 71 | Ht 70.0 in | Wt 379.2 lb

## 2022-11-07 DIAGNOSIS — E669 Obesity, unspecified: Secondary | ICD-10-CM

## 2022-11-07 DIAGNOSIS — E538 Deficiency of other specified B group vitamins: Secondary | ICD-10-CM | POA: Diagnosis not present

## 2022-11-07 DIAGNOSIS — Z113 Encounter for screening for infections with a predominantly sexual mode of transmission: Secondary | ICD-10-CM

## 2022-11-07 DIAGNOSIS — Z01419 Encounter for gynecological examination (general) (routine) without abnormal findings: Secondary | ICD-10-CM | POA: Insufficient documentation

## 2022-11-07 DIAGNOSIS — E559 Vitamin D deficiency, unspecified: Secondary | ICD-10-CM | POA: Diagnosis not present

## 2022-11-07 DIAGNOSIS — B977 Papillomavirus as the cause of diseases classified elsewhere: Secondary | ICD-10-CM | POA: Insufficient documentation

## 2022-11-07 MED ORDER — VITAMIN D (ERGOCALCIFEROL) 1.25 MG (50000 UNIT) PO CAPS: ORAL_CAPSULE | ORAL | 3 refills | Status: DC

## 2022-11-07 NOTE — Progress Notes (Signed)
36 y.o. [place] Married [place] or [place] American female here for annual exam.  Cycle is regular and lasts 7 days.  She reports she is working on some weight loss.  Desires blood work for STD testing due to need  Patient's last menstrual period was 10/23/2022 (exact date).          Sexually active: Yes.    The current method of family planning is none.    Smoker:  no  Health Maintenance: Pap:  10/04/21 neg History of abnormal Pap:  yes with +HR HPV MMG:  guidelines Colonoscopy:  guidelines reviewed Screening Labs: ordered   reports that she has quit smoking. She has never used smokeless tobacco. She reports that she does not currently use alcohol. She reports that she does not use drugs.  Past Medical History:  Diagnosis Date   HPV (human papilloma virus) anogenital infection    LGSIL (low grade squamous intraepithelial dysplasia)    Vitamin D deficiency disease     History reviewed. No pertinent surgical history.  Current Outpatient Medications  Medication Sig Dispense Refill   cyanocobalamin (,VITAMIN B-12,) 1000 MCG/ML injection INJECT 1 ML INTO THE MUSCLE EVERY 30 DAYS. 3 mL 5   Vitamin D, Ergocalciferol, (DRISDOL) 1.25 MG (50000 UNIT) CAPS capsule Take 1 capsules orally twice weekly 24 capsule 3   No current facility-administered medications for this visit.    Family History  Problem Relation Age of Onset   Diabetes Father    Heart disease Mother    Diabetes Sister     ROS: Constitutional: negative Genitourinary:negative  Exam:   Ht '5\' 10"'$  (1.778 m)   Wt (!) 379 lb 3.2 oz (172 kg)   LMP 10/23/2022 (Exact Date)   BMI 54.41 kg/m   Height: '5\' 10"'$  (177.8 cm)  General appearance: alert, cooperative and appears stated age Head: Normocephalic, without obvious abnormality, atraumatic Neck: no adenopathy, supple, symmetrical, trachea midline and thyroid normal to inspection and palpation Lungs: clear to auscultation bilaterally Breasts: normal appearance, no masses  or tenderness Heart: regular rate and rhythm Abdomen: soft, non-tender; bowel sounds normal; no masses,  no organomegaly Extremities: extremities normal, atraumatic, no cyanosis or edema Skin: Skin color, texture, turgor normal. No rashes or lesions Lymph nodes: Cervical, supraclavicular, and axillary nodes normal. No abnormal inguinal nodes palpated Neurologic: Grossly normal   Pelvic: External genitalia:  no lesions              Urethra:  normal appearing urethra with no masses, tenderness or lesions              Bartholins and Skenes: normal                 Vagina: normal appearing vagina with normal color and no discharge, no lesions              Cervix: no lesions              Pap taken: Yes.   Bimanual Exam:  Uterus:  normal size, contour, position, consistency, mobility, non-tender              Adnexa: normal adnexa and no mass, fullness, tenderness               Rectovaginal: Confirms               Anus:  normal sphincter tone, no lesions  Chaperone, Octaviano Batty, CMA, was present for exam.  Assessment/Plan: 1. Well woman exam with routine gynecological exam -  Pap smear obtained today - Mammogram guidelines reviewed - Colonoscopy guidelines reviewed - lab work ordered today - vaccines reviewed/updated  2. High risk HPV infection - Cytology - PAP( [facility])  3. Vitamin B 12 deficiency - B12  4. Vitamin D deficiency - VITAMIN D 25 Hydroxy (Vit-D Deficiency, Fractures) - Vitamin D, Ergocalciferol, (DRISDOL) 1.25 MG (50000 UNIT) CAPS capsule; Take 1 capsules orally twice weekly  Dispense: 24 capsule; Refill: 3  5. Obesity (BMI 35.0-39.9 without comorbidity) - Comprehensive metabolic panel - Hemoglobin A1c - Lipid panel  6. Screening examination for STD (sexually transmitted disease) - RPR+HBsAg+HIV - Hepatitis C antibody

## 2022-11-08 LAB — LIPID PANEL
Chol/HDL Ratio: 3.6 ratio (ref 0.0–4.4)
Cholesterol, Total: 158 mg/dL (ref 100–199)
HDL: 44 mg/dL (ref >39)
LDL Chol Calc (NIH): 101 mg/dL — ABNORMAL HIGH (ref 0–99)
Triglycerides: 64 mg/dL (ref 0–149)
VLDL Cholesterol Cal: 13 mg/dL (ref 5–40)

## 2022-11-08 LAB — COMPREHENSIVE METABOLIC PANEL
ALT: 24 IU/L (ref 0–32)
AST: 20 IU/L (ref 0–40)
Albumin/Globulin Ratio: 2 (ref 1.2–2.2)
Albumin: 4.2 g/dL (ref 3.9–4.9)
Alkaline Phosphatase: 43 IU/L — ABNORMAL LOW (ref 44–121)
BUN/Creatinine Ratio: 13 (ref 9–23)
BUN: 8 mg/dL (ref 6–20)
Bilirubin Total: 0.3 mg/dL (ref 0.0–1.2)
CO2: 22 mmol/L (ref 20–29)
Calcium: 8.8 mg/dL (ref 8.7–10.2)
Chloride: 104 mmol/L (ref 96–106)
Creatinine, Ser: 0.61 mg/dL (ref 0.57–1.00)
Globulin, Total: 2.1 g/dL (ref 1.5–4.5)
Glucose: 91 mg/dL (ref 70–99)
Potassium: 4.4 mmol/L (ref 3.5–5.2)
Sodium: 138 mmol/L (ref 134–144)
Total Protein: 6.3 g/dL (ref 6.0–8.5)
eGFR: 120 mL/min/{1.73_m2} (ref >59)

## 2022-11-08 LAB — VITAMIN D 25 HYDROXY (VIT D DEFICIENCY, FRACTURES): Vit D, 25-Hydroxy: 29.9 ng/mL — ABNORMAL LOW (ref 30.0–100.0)

## 2022-11-08 LAB — RPR+HBSAG+HIV
HIV Screen 4th Generation wRfx: NONREACTIVE
Hepatitis B Surface Ag: NEGATIVE
RPR Ser Ql: NONREACTIVE

## 2022-11-08 LAB — HEMOGLOBIN A1C
Est. average glucose Bld gHb Est-mCnc: 100 mg/dL
Hgb A1c MFr Bld: 5.1 % (ref 4.8–5.6)

## 2022-11-08 LAB — VITAMIN B12: Vitamin B-12: 1138 pg/mL (ref 232–1245)

## 2022-11-08 LAB — HEPATITIS C ANTIBODY: Hep C Virus Ab: NONREACTIVE

## 2022-11-12 LAB — CYTOLOGY - PAP
Adequacy: ABSENT
Comment: NEGATIVE
Diagnosis: NEGATIVE
High risk HPV: NEGATIVE

## 2023-04-25 ENCOUNTER — Other Ambulatory Visit (HOSPITAL_COMMUNITY): Payer: Self-pay

## 2023-12-02 ENCOUNTER — Encounter (HOSPITAL_BASED_OUTPATIENT_CLINIC_OR_DEPARTMENT_OTHER): Payer: Self-pay | Admitting: Obstetrics & Gynecology

## 2023-12-02 ENCOUNTER — Other Ambulatory Visit (HOSPITAL_COMMUNITY)
Admission: RE | Admit: 2023-12-02 | Discharge: 2023-12-02 | Disposition: A | Discharge status: Home / Self Care | Payer: Commercial Managed Care - PPO | Source: Ambulatory Visit | Attending: Obstetrics & Gynecology | Admitting: Obstetrics & Gynecology

## 2023-12-02 ENCOUNTER — Ambulatory Visit (HOSPITAL_BASED_OUTPATIENT_CLINIC_OR_DEPARTMENT_OTHER): Payer: Commercial Managed Care - PPO | Admitting: Obstetrics & Gynecology

## 2023-12-02 VITALS — BP 130/62 | HR 71 | Ht 70.0 in | Wt 388.8 lb

## 2023-12-02 DIAGNOSIS — Z124 Encounter for screening for malignant neoplasm of cervix: Secondary | ICD-10-CM | POA: Diagnosis not present

## 2023-12-02 DIAGNOSIS — E559 Vitamin D deficiency, unspecified: Secondary | ICD-10-CM | POA: Diagnosis not present

## 2023-12-02 DIAGNOSIS — R87810 Cervical high risk human papillomavirus (HPV) DNA test positive: Secondary | ICD-10-CM | POA: Insufficient documentation

## 2023-12-02 DIAGNOSIS — Z01419 Encounter for gynecological examination (general) (routine) without abnormal findings: Secondary | ICD-10-CM | POA: Diagnosis not present

## 2023-12-02 DIAGNOSIS — E538 Deficiency of other specified B group vitamins: Secondary | ICD-10-CM | POA: Diagnosis not present

## 2023-12-02 DIAGNOSIS — Z6841 Body Mass Index (BMI) 40.0 and over, adult: Secondary | ICD-10-CM

## 2023-12-02 MED ORDER — VITAMIN D (ERGOCALCIFEROL) 1.25 MG (50000 UNIT) PO CAPS: ORAL_CAPSULE | ORAL | 3 refills | Status: DC

## 2023-12-02 NOTE — Progress Notes (Signed)
37 y.o. G0P0000 Married [place] or [place] American female here for annual exam.  Flow are still regular.  Flow lasts 7 days.  On cycle today, started yesterday.  Does have some cramping.    Sexually active: Yes.    The current method of family planning is none.     Upstream - 12/02/23 1028       Pregnancy Intention Screening   Does the patient want to become pregnant in the next year? No    Does the patient's partner want to become pregnant in the next year? No           Smoker:  no  Health Maintenance: Pap:  11/07/2022 Negative History of abnormal Pap:  h/o +HR HPV MMG:  guidelines reviewed Colonoscopy:  guidelines reviewed Screening Labs: ordered today   reports that she has quit smoking. She has never used smokeless tobacco. She reports that she does not currently use alcohol. She reports that she does not use drugs.  Past Medical History:  Diagnosis Date   HPV (human papilloma virus) anogenital infection    LGSIL (low grade squamous intraepithelial dysplasia)    Vitamin D deficiency disease     No past surgical history on file.  Current Outpatient Medications  Medication Sig Dispense Refill   cyanocobalamin (,VITAMIN B-12,) 1000 MCG/ML injection INJECT 1 ML INTO THE MUSCLE EVERY 30 DAYS. (Patient not taking: Reported on 12/02/2023) 3 mL 5   Vitamin D, Ergocalciferol, (DRISDOL) 1.25 MG (50000 UNIT) CAPS capsule Take 1 capsules orally twice weekly 24 capsule 3   No current facility-administered medications for this visit.    Family History  Problem Relation Age of Onset   Diabetes Father    Heart disease Mother    Diabetes Sister     ROS: Constitutional: negative Genitourinary:negative  Exam:   BP 130/62 (BP Location: Right Arm, Patient Position: Sitting, Cuff Size: Large)   Pulse 71   Ht 5\' 10"  (1.778 m)   Wt (!) 388 lb 12.8 oz (176.4 kg)   LMP 12/02/2023   BMI 55.79 kg/m   Height: 5\' 10"  (177.8 cm)  General appearance: alert, cooperative and appears  stated age Head: Normocephalic, without obvious abnormality, atraumatic Neck: no adenopathy, supple, symmetrical, trachea midline and thyroid normal to inspection and palpation Lungs: clear to auscultation bilaterally Breasts: normal appearance, no masses or tenderness Heart: regular rate and rhythm Abdomen: soft, non-tender; bowel sounds normal; no masses,  no organomegaly Extremities: extremities normal, atraumatic, no cyanosis or edema Skin: Skin color, texture, turgor normal. No rashes or lesions Lymph nodes: Cervical, supraclavicular, and axillary nodes normal. No abnormal inguinal nodes palpated Neurologic: Grossly normal   Pelvic: External genitalia:  no lesions              Urethra:  normal appearing urethra with no masses, tenderness or lesions              Bartholins and Skenes: normal                 Vagina: normal appearing vagina with normal color and no discharge, no lesions              Cervix: no lesions              Pap taken: Yes.   Bimanual Exam:  Uterus:  normal size, contour, position, consistency, mobility, non-tender              Adnexa: normal adnexa and no mass, fullness, tenderness  Rectovaginal: Confirms               Anus:  normal sphincter tone, no lesions  Chaperone, Ina Homes, CMA, was present for exam.  Assessment/Plan: 1. Well woman exam with routine gynecological exam (Primary) - Pap smear obtained today with neg HR HPV - Mammogram guidelines reviewed - Colonoscopy guidelines reviewed - lab work ordered per below - vaccines reviewed/updated  2. Cervical high risk HPV (human papillomavirus) test positiv - Cytology - PAP( [facility])  3. Cervical cancer screening  4. Vitamin B 12 deficiency - Vitamin B12  5. Body mass index (BMI) 50.0-59.9, adult (HCC) - Hemoglobin A1c - Lipid panel - Comprehensive metabolic panel  6. Vitamin D deficiency - Vitamin D, Ergocalciferol, (DRISDOL) 1.25 MG (50000 UNIT) CAPS capsule; Take 1  capsules orally twice weekly  Dispense: 24 capsule; Refill: 3 - VITAMIN D 25 Hydroxy (Vit-D Deficiency, Fractures)

## 2023-12-03 LAB — VITAMIN B12: Vitamin B-12: 752 pg/mL (ref 232–1245)

## 2023-12-03 LAB — LIPID PANEL
Chol/HDL Ratio: 3.5 {ratio} (ref 0.0–4.4)
Cholesterol, Total: 181 mg/dL (ref 100–199)
HDL: 51 mg/dL (ref >39)
LDL Chol Calc (NIH): 111 mg/dL — ABNORMAL HIGH (ref 0–99)
Triglycerides: 102 mg/dL (ref 0–149)
VLDL Cholesterol Cal: 19 mg/dL (ref 5–40)

## 2023-12-03 LAB — COMPREHENSIVE METABOLIC PANEL
ALT: 22 [IU]/L (ref 0–32)
AST: 16 [IU]/L (ref 0–40)
Albumin: 4.1 g/dL (ref 3.9–4.9)
Alkaline Phosphatase: 47 [IU]/L (ref 44–121)
BUN/Creatinine Ratio: 15 (ref 9–23)
BUN: 10 mg/dL (ref 6–20)
Bilirubin Total: 0.3 mg/dL (ref 0.0–1.2)
CO2: 23 mmol/L (ref 20–29)
Calcium: 8.8 mg/dL (ref 8.7–10.2)
Chloride: 106 mmol/L (ref 96–106)
Creatinine, Ser: 0.68 mg/dL (ref 0.57–1.00)
Globulin, Total: 2.5 g/dL (ref 1.5–4.5)
Glucose: 78 mg/dL (ref 70–99)
Potassium: 4.1 mmol/L (ref 3.5–5.2)
Sodium: 140 mmol/L (ref 134–144)
Total Protein: 6.6 g/dL (ref 6.0–8.5)
eGFR: 116 mL/min/{1.73_m2} (ref >59)

## 2023-12-03 LAB — VITAMIN D 25 HYDROXY (VIT D DEFICIENCY, FRACTURES): Vit D, 25-Hydroxy: 35.1 ng/mL (ref 30.0–100.0)

## 2023-12-03 LAB — HEMOGLOBIN A1C
Est. average glucose Bld gHb Est-mCnc: 108 mg/dL
Hgb A1c MFr Bld: 5.4 % (ref 4.8–5.6)

## 2023-12-06 LAB — CYTOLOGY - PAP
Comment: NEGATIVE
Diagnosis: NEGATIVE
High risk HPV: NEGATIVE

## 2024-04-02 DIAGNOSIS — L700 Acne vulgaris: Secondary | ICD-10-CM | POA: Diagnosis not present

## 2024-04-02 DIAGNOSIS — D2339 Other benign neoplasm of skin of other parts of face: Secondary | ICD-10-CM | POA: Diagnosis not present

## 2024-04-30 ENCOUNTER — Encounter: Payer: Self-pay | Admitting: Plastic Surgery

## 2024-04-30 ENCOUNTER — Ambulatory Visit: Admitting: Plastic Surgery

## 2024-04-30 VITALS — BP 142/96 | HR 95 | Ht 70.0 in | Wt 390.6 lb

## 2024-04-30 DIAGNOSIS — L989 Disorder of the skin and subcutaneous tissue, unspecified: Secondary | ICD-10-CM | POA: Diagnosis not present

## 2024-04-30 NOTE — Progress Notes (Signed)
   Referring Provider Viola Greulich, MD [address] [place],  [place] [address]   CC:  Chief Complaint  Patient presents with   Consult      Christina Bowen is an 38 y.o. female.  HPI: Christina Bowen is a 38 year old female who presents today with complaints of a skin lesion along the left nasal sidewall.  She states this has been there since she was a child.  At some point cryotherapy was attempted to remove the lesion.  It is grown back.  Is been recommended by her primary care physician that she have this removed.  I agree that it should be removed for pathologic diagnosis.  No Known Allergies  Outpatient Encounter Medications as of 04/30/2024  Medication Sig   Vitamin D , Ergocalciferol , (DRISDOL ) 1.25 MG (50000 UNIT) CAPS capsule Take 1 capsules orally twice weekly   cyanocobalamin  (,VITAMIN B-12,) 1000 MCG/ML injection INJECT 1 ML INTO THE MUSCLE EVERY 30 DAYS. (Patient not taking: Reported on 12/02/2023)   No facility-administered encounter medications on file as of 04/30/2024.     Past Medical History:  Diagnosis Date   HPV (human papilloma virus) anogenital infection    LGSIL (low grade squamous intraepithelial dysplasia)    Vitamin D  deficiency disease     No past surgical history on file.  Family History  Problem Relation Age of Onset   Diabetes Father    Heart disease Mother    Diabetes Sister     Social History   Social History Narrative   Not on file     Review of Systems General: Denies fevers, chills, weight loss CV: Denies chest pain, shortness of breath, palpitations Skin lesion: Skin lesion present since childhood.  No pain or drainage associated with it.  Physical Exam    04/30/2024    9:35 AM 12/02/2023   10:15 AM 11/07/2022    8:57 AM  Vitals with BMI  Height 5\' 10"  5\' 10"  5\' 10"   Weight 390 lbs 10 oz 388 lbs 13 oz 379 lbs 3 oz  BMI 56.05 55.79 54.41  Systolic 142 130 829  Diastolic 96 62 68  Pulse 95 71 71    General:  No  acute distress,  Alert and oriented, Non-Toxic, Normal speech and affect Skin lesion: Patient has a 1 cm x 5 mm skin lesion on the left nasal sidewall.  Mammogram: Not applicable Assessment/Plan Skin lesion: Given the chronicity of the skin lesion it is most likely benign however this cannot be known without removal and sending to pathology.  I have discussed this with her and she would like to proceed with removal of the skin lesion.  I have showed her where the incision will be and how I will try to camouflage this in the shadows of the nose.  She understands I will close the skin lesion with permanent sutures that will need to be removed 5 to 7 days after surgery.  She may require an additional procedure based on pathology.  All questions were answered to her satisfaction.  Photographs were obtained today with her consent.  Will schedule her for removal in the office at her request.  Christina Bowen 04/30/2024, 9:56 AM

## 2024-05-28 ENCOUNTER — Ambulatory Visit: Admitting: Plastic Surgery

## 2024-05-28 ENCOUNTER — Encounter: Payer: Self-pay | Admitting: Plastic Surgery

## 2024-05-28 VITALS — BP 128/82 | HR 80 | Ht 70.0 in | Wt 390.0 lb

## 2024-05-28 DIAGNOSIS — L818 Other specified disorders of pigmentation: Secondary | ICD-10-CM | POA: Diagnosis not present

## 2024-05-28 DIAGNOSIS — L989 Disorder of the skin and subcutaneous tissue, unspecified: Secondary | ICD-10-CM

## 2024-05-28 DIAGNOSIS — L814 Other melanin hyperpigmentation: Secondary | ICD-10-CM | POA: Diagnosis not present

## 2024-05-28 NOTE — Progress Notes (Signed)
 Procedure Note  Preoperative Dx: Left facial skin lesion  Postoperative Dx: Same  Procedure: Excision of skin lesion  Anesthesia: Lidocaine 1% with 1:100,000 epinephrine and 0.25% Sensorcaine   Indication for Procedure: Removal of pathologic diagnosis  Description of Procedure: Risks and complications were explained to the patient including the need for additional procedures based on pathology.  Consent was confirmed and the patient understands the risks and benefits.  The potential complications and alternatives were explained and the patient consents.  The patient expressed understanding the option of not having the procedure and the risks of a scar.  Time out was called and all information was confirmed to be correct.    The area was prepped and drapped.  Local anesthetic was injected in the subcutaneous tissues.  After waiting for the local to take affect an elliptical incision was made around the skin lesion.  After obtaining hemostasis, the surgical wound was closed with interrupted 5-0 Prolene sutures.  The surgical wound measured 1 cm.  A dressing was applied.  The patient was given instructions on how to care for the area and a follow up appointment.  Christina Bowen tolerated the procedure well and there were no complications. The specimen was sent to pathology.

## 2024-06-01 LAB — DERMATOLOGY PATHOLOGY

## 2024-06-03 ENCOUNTER — Ambulatory Visit: Admitting: Surgical

## 2024-06-03 DIAGNOSIS — L989 Disorder of the skin and subcutaneous tissue, unspecified: Secondary | ICD-10-CM

## 2024-06-03 NOTE — Progress Notes (Signed)
 38 year old female here for follow-up after excision of left cheek skin lesion with Dr. Waddell on 05/28/2024.  She is doing well.  She is here with her spouse.  She does not report any issues.  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    Pathology: LENTIGO/ BASILAR HYPERPIGMENTATION WITH BACKGROUND PROMINENT SEBACEOUS GLANDS   On exam left cheek incision is intact, appears to be overall healing well.  There is some very slight separation of about  0.5 mm.  There is no erythema or cellulitic changes.  There is some slight scabbing noted.  No swelling noted.  No subcutaneous fluid collection noted.  Prolene sutures removed, patient tolerates well.  Recommend and discussed the importance of sunscreen, scar cream and light massage.  Recommend following up as needed.  Call questions or concerns.

## 2024-11-27 ENCOUNTER — Telehealth (INDEPENDENT_AMBULATORY_CARE_PROVIDER_SITE_OTHER): Admitting: Family Medicine

## 2024-11-27 DIAGNOSIS — J0100 Acute maxillary sinusitis, unspecified: Secondary | ICD-10-CM

## 2024-11-27 MED ORDER — AMOXICILLIN-POT CLAVULANATE 875-125 MG PO TABS: 1.0000 | ORAL_TABLET | Freq: Two times a day (BID) | ORAL | 0 refills | Status: AC

## 2024-11-27 NOTE — Progress Notes (Signed)
" ° °  Virtual Medical Office Visit  Patient:  Christina Bowen      Age: 38 y.o.       Sex:  female  Date:   11/27/2024  PCP:    Mercer Clotilda SAUNDERS, MD   Today's Healthcare Provider: Heron CHRISTELLA Sharper, MD    Assessment/Plan:   Summary assessment:  Acute non-recurrent maxillary sinusitis -     Amoxicillin -Pot Clavulanate; Take 1 tablet by mouth 2 (two) times daily for 7 days.  Dispense: 14 tablet; Refill: 0   Assessment and Plan    Acute maxillary sinusitis Sinus pain and congestion for five days, likely exacerbated by discontinuation of Claritin. No fever, chills, sore throat, or ear pain. Chronic sinus issues present. No warning signs such as chest pain, shortness of breath, or headaches. Likely bacterial overgrowth due to chronic sinus issues. - Prescribed Augmentin  1 tablet twice a day for 7 days. - Advised to monitor for antibiotic-associated diarrhea and yeast infection. - Sent prescription to on [facility].        No follow-ups on file.   She was advised to call the office or go to ER if her condition worsens    Subjective:   Christina Bowen is a 38 y.o. female with PMH significant for: Past Medical History:  Diagnosis Date   HPV (human papilloma virus) anogenital infection    LGSIL (low grade squamous intraepithelial dysplasia)    Vitamin D  deficiency disease      Presenting today with: No chief complaint on file.    She clarifies and reports that her condition: Discussed the use of AI scribe software for clinical note transcription with the patient, who gave verbal consent to proceed.  History of Present Illness   Becki Bowen is a 38 year old female with chronic sinus issues who presents with sinus congestion and postnasal drip.  For five days she has had significant sinus congestion, postnasal drip, and sinus pain around her nose. She denies fever, chills, sore throat, or ear pain, though she had mild throat discomfort earlier from the  drainage. Warm compresses provide some relief. She stopped Claritin for a week before onset and thinks this triggered her symptoms. 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 No household sick contacts.       She denies having any: Fever/chills, no chest pain or difficulty breathing           Objective/Observations  Physical Exam:  Polite and friendly Gen: NAD, resting comfortably Pulm: Normal work of breathing Neuro: Grossly normal, moves all extremities Psych: Normal affect and thought content Problem specific physical exam findings:  N/A  No images are attached to the encounter or orders placed in the encounter.    Results: No results found for any visits on 11/27/24.   No results found for this or any previous visit (from the past 2160 hours).         Virtual Visit via Video   I connected with Christina Bowen on 11/27/2024 at  1:00 PM EST by a video enabled telemedicine application and verified that I am speaking with the correct person using two identifiers. The limitations of evaluation and management by telemedicine and the availability of in person appointments were discussed. The patient expressed understanding and agreed to proceed.   Percentage of appointment time on video:  100% Patient location: Home Provider location: [facility] Persons participating in the virtual visit: Myself and Patient     "

## 2024-12-07 ENCOUNTER — Other Ambulatory Visit (HOSPITAL_COMMUNITY)
Admission: RE | Admit: 2024-12-07 | Discharge: 2024-12-07 | Disposition: A | Discharge status: Home / Self Care | Source: Ambulatory Visit | Attending: Obstetrics & Gynecology | Admitting: Obstetrics & Gynecology

## 2024-12-07 ENCOUNTER — Ambulatory Visit (HOSPITAL_BASED_OUTPATIENT_CLINIC_OR_DEPARTMENT_OTHER): Payer: Commercial Managed Care - PPO | Admitting: Obstetrics & Gynecology

## 2024-12-07 ENCOUNTER — Encounter (HOSPITAL_BASED_OUTPATIENT_CLINIC_OR_DEPARTMENT_OTHER): Payer: Self-pay | Admitting: Obstetrics & Gynecology

## 2024-12-07 VITALS — BP 128/88 | HR 79 | Ht 70.0 in | Wt 391.4 lb

## 2024-12-07 DIAGNOSIS — Z1331 Encounter for screening for depression: Secondary | ICD-10-CM | POA: Diagnosis not present

## 2024-12-07 DIAGNOSIS — R87810 Cervical high risk human papillomavirus (HPV) DNA test positive: Secondary | ICD-10-CM | POA: Insufficient documentation

## 2024-12-07 DIAGNOSIS — N920 Excessive and frequent menstruation with regular cycle: Secondary | ICD-10-CM | POA: Diagnosis not present

## 2024-12-07 DIAGNOSIS — E559 Vitamin D deficiency, unspecified: Secondary | ICD-10-CM | POA: Diagnosis not present

## 2024-12-07 DIAGNOSIS — E7800 Pure hypercholesterolemia, unspecified: Secondary | ICD-10-CM | POA: Diagnosis not present

## 2024-12-07 DIAGNOSIS — B977 Papillomavirus as the cause of diseases classified elsewhere: Secondary | ICD-10-CM | POA: Insufficient documentation

## 2024-12-07 DIAGNOSIS — Z1151 Encounter for screening for human papillomavirus (HPV): Secondary | ICD-10-CM

## 2024-12-07 DIAGNOSIS — L700 Acne vulgaris: Secondary | ICD-10-CM | POA: Diagnosis not present

## 2024-12-07 DIAGNOSIS — E538 Deficiency of other specified B group vitamins: Secondary | ICD-10-CM

## 2024-12-07 DIAGNOSIS — Z7721 Contact with and (suspected) exposure to potentially hazardous body fluids: Secondary | ICD-10-CM | POA: Diagnosis not present

## 2024-12-07 DIAGNOSIS — Z6841 Body Mass Index (BMI) 40.0 and over, adult: Secondary | ICD-10-CM

## 2024-12-07 DIAGNOSIS — Z01419 Encounter for gynecological examination (general) (routine) without abnormal findings: Secondary | ICD-10-CM

## 2024-12-07 MED ORDER — CLINDAMYCIN PHOS-BENZOYL PEROX 1-5 % EX GEL: Freq: Two times a day (BID) | CUTANEOUS | 3 refills | Status: AC

## 2024-12-07 NOTE — Progress Notes (Addendum)
 "  ANNUAL EXAM Patient name: Christina Bowen MRN [id number]  Date of birth: [birth year] Chief Complaint:   Annual Exam  History of Present Illness:   Christina Bowen is a 39 y.o. G0P0000 African-American female being seen today for a routine annual exam.  Menstrual cycles are normal and flow lasts 7 days.  Family hx of fibroids in her mother.  Flow has gotten long and heavier.    H/o Vit D deficiency.  Taking prescription Vit D.  Needs to have checked today as well as her B12.    Has acne and did see dermatology.  H/o acne.  Would like RF for clindamycin  gel.    Patient's last menstrual period was 11/19/2024 (approximate).  Last pap 12/02/2023. Results were: NILM w/ HRHPV negative. H/O abnormal pap: yes Last mammogram: N/A. Family h/o breast cancer: no Last colonoscopy: N/A. Family h/o colorectal cancer: no     12/07/2024    8:36 AM 12/02/2023   10:28 AM 11/07/2022    8:57 AM 10/24/2021    3:08 PM 10/13/2021   11:37 AM  Depression screen PHQ 2/9  Decreased Interest 0 0 0 0 0  Down, Depressed, Hopeless 0 0 0 0 0  PHQ - 2 Score 0 0 0 0 0  Altered sleeping     0  Tired, decreased energy     0  Change in appetite     0  Feeling bad or failure about yourself      0  Trouble concentrating     0  Moving slowly or fidgety/restless     0  Suicidal thoughts     0  PHQ-9 Score     0      Data saved with a previous flowsheet row definition        12/07/2024    8:36 AM 10/13/2021   11:37 AM 09/15/2020    2:52 PM  GAD 7 : Generalized Anxiety Score  Nervous, Anxious, on Edge 0 0 3  Control/stop worrying 0 0 1  Worry too much - different things 0 0 1  Trouble relaxing 0 0 1  Restless 0 0 0  Easily annoyed or irritable 0 0 1  Afraid - awful might happen 0 0 0  Total GAD 7 Score 0 0 7  Anxiety Difficulty Not difficult at all  Somewhat difficult     Review of Systems:   Pertinent items are noted in HPI Denies any urinary or bowel change, no pelvic pain.    Pertinent  History Reviewed:  Reviewed past medical,surgical, social and family history.  Reviewed problem list, medications and allergies. Physical Assessment:   Vitals:   12/07/24 0830 12/07/24 0917  BP: (!) 136/91 128/88  Pulse: 79   SpO2: 100%   Weight: (!) 391 lb 6.4 oz (177.5 kg)   Height: 5' 10 (1.778 m)   Body mass index is 56.16 kg/m.        Physical Examination:   General appearance - well appearing, and in no distress  Mental status - alert, oriented to person, place, and time  Psych:  She has a normal mood and affect  Skin - warm and dry, normal color, no suspicious lesions noted  Chest - effort normal, all lung fields clear to auscultation bilaterally  Heart - normal rate and regular rhythm  Neck:  midline trachea, no thyromegaly or nodules  Breasts - breasts appear normal, no suspicious masses, no skin or nipple changes or  axillary nodes  Abdomen - soft, nontender, nondistended, no masses or organomegaly  Pelvic - VULVA: normal appearing vulva with no masses, tenderness or lesions   VAGINA: normal appearing vagina with normal color and discharge, no lesions   CERVIX: normal appearing cervix without discharge or lesions, no CMT  Thin prep pap is updated today  UTERUS: uterus is felt to be normal size, shape, consistency and nontender   ADNEXA: No adnexal masses or tenderness noted.  Rectal - normal rectal, good sphincter tone, no masses felt.  Extremities:  No swelling or varicosities noted  Chaperone present for exam  No results found for this or any previous visit (from the past 24 hours).  Assessment & Plan:  1. Well woman exam with routine gynecological exam (Primary) - Pap smear with HR obtained today per pt request - Mammogram not indicated - Colonoscopy not indicated - lab work done ordered per below - vaccines reviewed/updated.  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  Will try to get me the dates.  2. Acne vulgaris - rx  for clindamycin /benzoyl peroxide  sent to rx on ril - clindamycin -benzoyl peroxide  (BENZACLIN) gel; Apply topically 2 (two) times daily.  Dispense: 25 g; Refill: 3  3. Exposure to blood - RPR+HBsAg+HIV - Hepatitis C antibody  4. Vitamin B 12 deficiency - Vitamin B12  5. Vitamin D  deficiency - VITAMIN D  25 Hydroxy (Vit-D Deficiency, Fractures)  6. Elevated LDL cholesterol level - Lipid panel  7. BMI 50.0-59.9, adult (HCC) - Hemoglobin A1c  8. Menorrhagia with regular cycle - discussed ultrasound for imaging.  She would consider repeat ultrasound if symptoms worsen.   Orders Placed This Encounter  Procedures   Vitamin B12   VITAMIN D  25 Hydroxy (Vit-D Deficiency, Fractures)   Lipid panel   Hemoglobin A1c   RPR+HBsAg+HIV   Hepatitis C antibody    Meds:  Meds ordered this encounter  Medications   clindamycin -benzoyl peroxide  (BENZACLIN) gel    Sig: Apply topically 2 (two) times daily.    Dispense:  25 g    Refill:  3    Follow-up: Return in about 1 year (around 12/07/2025).  Ronal GORMAN Pinal, MD 12/07/2024 9:31 AM  "

## 2024-12-08 LAB — LIPID PANEL
Chol/HDL Ratio: 3.8 ratio (ref 0.0–4.4)
Cholesterol, Total: 183 mg/dL (ref 100–199)
HDL: 48 mg/dL
LDL Chol Calc (NIH): 113 mg/dL — ABNORMAL HIGH (ref 0–99)
Triglycerides: 123 mg/dL (ref 0–149)
VLDL Cholesterol Cal: 22 mg/dL (ref 5–40)

## 2024-12-08 LAB — VITAMIN B12: Vitamin B-12: 572 pg/mL (ref 232–1245)

## 2024-12-08 LAB — RPR+HBSAG+HIV
HIV Screen 4th Generation wRfx: NONREACTIVE
Hepatitis B Surface Ag: NEGATIVE
RPR Ser Ql: NONREACTIVE

## 2024-12-08 LAB — VITAMIN D 25 HYDROXY (VIT D DEFICIENCY, FRACTURES): Vit D, 25-Hydroxy: 37.9 ng/mL (ref 30.0–100.0)

## 2024-12-08 LAB — HEMOGLOBIN A1C
Est. average glucose Bld gHb Est-mCnc: 105 mg/dL
Hgb A1c MFr Bld: 5.3 % (ref 4.8–5.6)

## 2024-12-08 LAB — HEPATITIS C ANTIBODY: Hep C Virus Ab: NONREACTIVE

## 2024-12-09 ENCOUNTER — Other Ambulatory Visit (HOSPITAL_BASED_OUTPATIENT_CLINIC_OR_DEPARTMENT_OTHER): Payer: Self-pay | Admitting: Obstetrics & Gynecology

## 2024-12-09 ENCOUNTER — Encounter (HOSPITAL_BASED_OUTPATIENT_CLINIC_OR_DEPARTMENT_OTHER): Payer: Self-pay | Admitting: Obstetrics & Gynecology

## 2024-12-09 DIAGNOSIS — E559 Vitamin D deficiency, unspecified: Secondary | ICD-10-CM

## 2024-12-09 LAB — CYTOLOGY - PAP
Adequacy: ABSENT
Comment: NEGATIVE
Comment: NEGATIVE
Comment: NEGATIVE
Diagnosis: UNDETERMINED — AB
HPV 16: NEGATIVE
HPV 18 / 45: NEGATIVE
High risk HPV: POSITIVE — AB

## 2024-12-11 ENCOUNTER — Ambulatory Visit (HOSPITAL_BASED_OUTPATIENT_CLINIC_OR_DEPARTMENT_OTHER): Payer: Self-pay | Admitting: Obstetrics & Gynecology

## 2024-12-11 ENCOUNTER — Other Ambulatory Visit (HOSPITAL_BASED_OUTPATIENT_CLINIC_OR_DEPARTMENT_OTHER): Payer: Self-pay

## 2024-12-11 DIAGNOSIS — E559 Vitamin D deficiency, unspecified: Secondary | ICD-10-CM

## 2024-12-11 MED ORDER — VITAMIN D (ERGOCALCIFEROL) 1.25 MG (50000 UNIT) PO CAPS: ORAL_CAPSULE | ORAL | 3 refills | Status: AC

## 2024-12-24 ENCOUNTER — Encounter (HOSPITAL_BASED_OUTPATIENT_CLINIC_OR_DEPARTMENT_OTHER): Payer: Self-pay | Admitting: Obstetrics & Gynecology

## 2024-12-24 ENCOUNTER — Other Ambulatory Visit (HOSPITAL_COMMUNITY)
Admission: RE | Admit: 2024-12-24 | Discharge: 2024-12-24 | Disposition: A | Source: Ambulatory Visit | Attending: Obstetrics & Gynecology | Admitting: Obstetrics & Gynecology

## 2024-12-24 ENCOUNTER — Ambulatory Visit (HOSPITAL_BASED_OUTPATIENT_CLINIC_OR_DEPARTMENT_OTHER): Payer: Self-pay | Admitting: Obstetrics & Gynecology

## 2024-12-24 VITALS — BP 133/81 | HR 88 | Ht 70.0 in | Wt 395.4 lb

## 2024-12-24 DIAGNOSIS — R87610 Atypical squamous cells of undetermined significance on cytologic smear of cervix (ASC-US): Secondary | ICD-10-CM | POA: Insufficient documentation

## 2024-12-24 DIAGNOSIS — R87810 Cervical high risk human papillomavirus (HPV) DNA test positive: Secondary | ICD-10-CM | POA: Diagnosis present

## 2024-12-24 NOTE — Progress Notes (Signed)
" ° °  CC:  colposcopy, h/o abnormal pap smear  39 y.o. G0P0000 Married Not Hispanic or Latino female here for colposcopy with possible biopsies and/or ECC due to ASC-US  with positive HR HPV not specified. Pap obtained 12/07/2024.    Prior evaluation/treatment:  colposcopy in 2022.  Patient's last menstrual period was 11/19/2024 (approximate).           Patient has been counseled about results and procedure.  Risks and benefits have bene reviewed including immediate and/or delayed bleeding, infection, cervical scaring from procedure, possibility of needing additional follow up as well as treatment.  Rare risks of missing a lesion discussed as well.  All questions answered.  Pt ready to proceed.  Consent obtained.  LMP 11/19/2024 (Approximate)   General appearance: alert, cooperative and appears stated age Lymph nodes: No abnormal inguinal nodes palpated Neurologic: Grossly normal  Pelvic: External genitalia:  no lesions              Urethra:  normal appearing urethra with no masses, tenderness or lesions              Bartholins and Skenes: normal                 Vagina: normal appearing vagina with normal color and no discharge, no lesions                Speculum placed.  3% acetic acid applied to cervix for >45 seconds.  Cervix visualized with both 7.5X and 15X magnification.  Green filter also used.  Lugols solution was not used.  Findings:  HPV changes only noted.  No AWE.  Biopsy:  not obtained.  ECC:  was performed.  Monsel's was not needed.  Excellent hemostasis was present.  Pt tolerated procedure well and all instruments were removed.    Chaperone was present during procedure.  Assessment/Plan: 1. 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 "

## 2024-12-28 LAB — SURGICAL PATHOLOGY

## 2024-12-29 ENCOUNTER — Ambulatory Visit (HOSPITAL_BASED_OUTPATIENT_CLINIC_OR_DEPARTMENT_OTHER): Payer: Self-pay | Admitting: Obstetrics & Gynecology

## 2025-12-09 ENCOUNTER — Ambulatory Visit (HOSPITAL_BASED_OUTPATIENT_CLINIC_OR_DEPARTMENT_OTHER): Payer: Self-pay | Admitting: Obstetrics & Gynecology
